# Patient Record
Sex: Male | Born: 1980 | Race: Black or African American | Hispanic: No | Marital: Single | Smoking: Current every day smoker
Health system: Southern US, Community
[De-identification: ages and names within clinical notes are randomized; demographics above are authoritative.]

## PROBLEM LIST (undated history)

## (undated) DIAGNOSIS — N200 Calculus of kidney: Secondary | ICD-10-CM

## (undated) DIAGNOSIS — R001 Bradycardia, unspecified: Secondary | ICD-10-CM

## (undated) HISTORY — DX: Bradycardia, unspecified: R00.1

## (undated) HISTORY — DX: Calculus of kidney: N20.0

---

## 2020-02-11 ENCOUNTER — Encounter: Payer: Self-pay | Admitting: Emergency Medicine

## 2020-02-11 ENCOUNTER — Emergency Department: Payer: Self-pay

## 2020-02-11 ENCOUNTER — Emergency Department
Admission: EM | Admit: 2020-02-11 | Discharge: 2020-02-11 | Disposition: A | Discharge status: Home / Self Care | Payer: Self-pay | Attending: Emergency Medicine | Admitting: Emergency Medicine

## 2020-02-11 ENCOUNTER — Other Ambulatory Visit: Payer: Self-pay

## 2020-02-11 DIAGNOSIS — S91114A Laceration without foreign body of right lesser toe(s) without damage to nail, initial encounter: Secondary | ICD-10-CM | POA: Insufficient documentation

## 2020-02-11 DIAGNOSIS — X58XXXA Exposure to other specified factors, initial encounter: Secondary | ICD-10-CM | POA: Insufficient documentation

## 2020-02-11 DIAGNOSIS — Z5321 Procedure and treatment not carried out due to patient leaving prior to being seen by health care provider: Secondary | ICD-10-CM | POA: Insufficient documentation

## 2020-02-11 NOTE — ED Triage Notes (Signed)
Pt in w/R 4th toe warmth, swelling. Noticed a cut to dorsal side of 4th toe on Friday, states he gave his dogs a bath that night, and swelling became worse. No fevers, does report clear and brown drainage. Has tried soaking in Epsom salt and A&D cream w/no relief. Able to bear weight on RLE

## 2020-04-27 ENCOUNTER — Encounter: Payer: Self-pay | Admitting: Emergency Medicine

## 2020-04-27 ENCOUNTER — Emergency Department
Admission: EM | Admit: 2020-04-27 | Discharge: 2020-04-27 | Disposition: A | Discharge status: Home / Self Care | Payer: Self-pay | Attending: Emergency Medicine | Admitting: Emergency Medicine

## 2020-04-27 ENCOUNTER — Emergency Department: Payer: Self-pay

## 2020-04-27 DIAGNOSIS — N200 Calculus of kidney: Secondary | ICD-10-CM

## 2020-04-27 DIAGNOSIS — F17210 Nicotine dependence, cigarettes, uncomplicated: Secondary | ICD-10-CM | POA: Insufficient documentation

## 2020-04-27 DIAGNOSIS — N132 Hydronephrosis with renal and ureteral calculous obstruction: Secondary | ICD-10-CM | POA: Insufficient documentation

## 2020-04-27 LAB — COMPREHENSIVE METABOLIC PANEL
ALT: 16 U/L (ref 0–44)
AST: 25 U/L (ref 15–41)
Albumin: 3.9 g/dL (ref 3.5–5.0)
Alkaline Phosphatase: 53 U/L (ref 38–126)
Anion gap: 9 (ref 5–15)
BUN: 13 mg/dL (ref 6–20)
CO2: 27 mmol/L (ref 22–32)
Calcium: 9.2 mg/dL (ref 8.9–10.3)
Chloride: 102 mmol/L (ref 98–111)
Creatinine, Ser: 1.11 mg/dL (ref 0.61–1.24)
GFR, Estimated: >60 mL/min (ref >60)
Glucose, Bld: 116 mg/dL — ABNORMAL HIGH (ref 70–99)
Potassium: 3.8 mmol/L (ref 3.5–5.1)
Sodium: 138 mmol/L (ref 135–145)
Total Bilirubin: 0.8 mg/dL (ref 0.3–1.2)
Total Protein: 7.1 g/dL (ref 6.5–8.1)

## 2020-04-27 LAB — URINALYSIS, COMPLETE (UACMP) WITH MICROSCOPIC
Bacteria, UA: NONE SEEN
Bilirubin Urine: NEGATIVE
Glucose, UA: NEGATIVE mg/dL
Hgb urine dipstick: NEGATIVE
Ketones, ur: 5 mg/dL — AB
Leukocytes,Ua: NEGATIVE
Nitrite: NEGATIVE
Protein, ur: NEGATIVE mg/dL
Specific Gravity, Urine: 1.027 (ref 1.005–1.030)
Squamous Epithelial / HPF: NONE SEEN (ref 0–5)
pH: 5 (ref 5.0–8.0)

## 2020-04-27 LAB — CBC
HCT: 42.8 % (ref 39.0–52.0)
Hemoglobin: 14.4 g/dL (ref 13.0–17.0)
MCH: 30.3 pg (ref 26.0–34.0)
MCHC: 33.6 g/dL (ref 30.0–36.0)
MCV: 90.1 fL (ref 80.0–100.0)
Platelets: 224 10*3/uL (ref 150–400)
RBC: 4.75 MIL/uL (ref 4.22–5.81)
RDW: 13 % (ref 11.5–15.5)
WBC: 6.3 10*3/uL (ref 4.0–10.5)
nRBC: 0 % (ref 0.0–0.2)

## 2020-04-27 LAB — LIPASE, BLOOD: Lipase: 38 U/L (ref 11–51)

## 2020-04-27 MED ORDER — ONDANSETRON 4 MG PO TBDP: 4.0000 mg | ORAL_TABLET | Freq: Four times a day (QID) | ORAL | 0 refills | Status: DC | PRN

## 2020-04-27 MED ORDER — MORPHINE SULFATE (PF) 4 MG/ML IV SOLN
4.0000 mg | Freq: Once | INTRAVENOUS | Status: AC
  Administered 2020-04-27: 4 mg via INTRAVENOUS
  Filled 2020-04-27: qty 1

## 2020-04-27 MED ORDER — IBUPROFEN 800 MG PO TABS: 800.0000 mg | ORAL_TABLET | Freq: Three times a day (TID) | ORAL | 0 refills | Status: DC | PRN

## 2020-04-27 MED ORDER — SODIUM CHLORIDE 0.9 % IV BOLUS (SEPSIS)
1000.0000 mL | Freq: Once | INTRAVENOUS | Status: AC
  Administered 2020-04-27: 1000 mL via INTRAVENOUS

## 2020-04-27 MED ORDER — ONDANSETRON HCL 4 MG/2ML IJ SOLN
4.0000 mg | Freq: Once | INTRAMUSCULAR | Status: AC
  Administered 2020-04-27: 4 mg via INTRAVENOUS
  Filled 2020-04-27: qty 2

## 2020-04-27 MED ORDER — TAMSULOSIN HCL 0.4 MG PO CAPS: 0.4000 mg | ORAL_CAPSULE | Freq: Every day | ORAL | 0 refills | Status: DC

## 2020-04-27 MED ORDER — OXYCODONE-ACETAMINOPHEN 7.5-325 MG PO TABS
2.0000 | ORAL_TABLET | Freq: Four times a day (QID) | ORAL | 0 refills | Status: AC | PRN
Start: 2020-04-27 — End: 2021-04-27

## 2020-04-27 MED ORDER — KETOROLAC TROMETHAMINE 30 MG/ML IJ SOLN
30.0000 mg | Freq: Once | INTRAMUSCULAR | Status: AC
  Administered 2020-04-27: 30 mg via INTRAVENOUS
  Filled 2020-04-27: qty 1

## 2020-04-27 NOTE — ED Provider Notes (Signed)
[facility] Emergency Department Provider Note  ____________________________________________   Event Date/Time   First MD Initiated Contact with Patient 04/27/20 [phone]     (approximate)  I have reviewed the triage vital signs and the nursing notes.   HISTORY  Chief Complaint Flank Pain    HPI Jake Lee is a 40 y.o. male with no significant past medical history who presented to the emergency department sudden onset right-sided flank pain that he describes as a pressure that radiates into the lower abdomen that started suddenly tonight while sleeping and woke him from sleep.  He denies nausea, vomiting, diarrhea, dysuria, hematuria, fever.  States he was sweating with the pain.  He has never had similar symptoms.  No previous abdominal surgery.  No history of kidney stones.        History reviewed. No pertinent past medical history.  There are no problems to display for this patient.   History reviewed. No pertinent surgical history.  Prior to Admission medications   Medication Sig Start Date End Date Taking? Authorizing Provider  ibuprofen (ADVIL) 800 MG tablet Take 1 tablet (800 mg total) by mouth every 8 (eight) hours as needed for mild pain. 04/27/20  Yes Ninoska Goswick N, DO  ondansetron (ZOFRAN ODT) 4 MG disintegrating tablet Take 1 tablet (4 mg total) by mouth every 6 (six) hours as needed for nausea or vomiting. 04/27/20  Yes Dwain Huhn, Layla Maw, DO  oxyCODONE-acetaminophen (PERCOCET) 7.5-325 MG tablet Take 2 tablets by mouth every 6 (six) hours as needed for severe pain. 04/27/20 04/27/21 Yes Latrel Szymczak, Layla Maw, DO  tamsulosin (FLOMAX) 0.4 MG CAPS capsule Take 1 capsule (0.4 mg total) by mouth daily. Take until stone passes. 04/27/20  Yes Akiera Allbaugh, Layla Maw, DO    Allergies Patient has no known allergies.  History reviewed. No pertinent family history.  Social History Social History   Tobacco Use  . Smoking status: Current Every Day Smoker    Packs/day:  0.50    Types: Cigarettes  . Smokeless tobacco: Never Used  Substance Use Topics  . Alcohol use: Not Currently  . Drug use: Not Currently    Review of Systems Constitutional: No fever. Eyes: No visual changes. ENT: No sore throat. Cardiovascular: Denies chest pain. Respiratory: Denies shortness of breath. Gastrointestinal: No nausea, vomiting, diarrhea. Genitourinary: Negative for dysuria. Musculoskeletal: Negative for back pain. Skin: Negative for rash. Neurological: Negative for focal weakness or numbness.  ____________________________________________   PHYSICAL EXAM:  VITAL SIGNS: ED Triage Vitals  Enc Vitals Group     BP 04/27/20 0554 (!) 149/96     Pulse Rate 04/27/20 0554 62     Resp 04/27/20 0554 20     Temp 04/27/20 0554 98.7 F (37.1 C)     Temp Source 04/27/20 0554 Oral     SpO2 04/27/20 0554 100 %     Weight 04/27/20 0552 165 lb (74.8 kg)     Height 04/27/20 0552 5\' 11"  (1.803 m)     Head Circumference --      Peak Flow --      Pain Score --      Pain Loc --      Pain Edu? --      Excl. in GC? --    CONSTITUTIONAL: Alert and oriented and responds appropriately to questions.  Appears uncomfortable. HEAD: Normocephalic EYES: Conjunctivae clear, pupils appear equal, EOM appear intact ENT: normal nose; moist mucous membranes NECK: Supple, normal ROM CARD: RRR; S1 and S2  appreciated; no murmurs, no clicks, no rubs, no gallops RESP: Normal chest excursion without splinting or tachypnea; breath sounds clear and equal bilaterally; no wheezes, no rhonchi, no rales, no hypoxia or respiratory distress, speaking full sentences ABD/GI: Normal bowel sounds; non-distended; soft, non-tender, no rebound, no guarding, no peritoneal signs, no hepatosplenomegaly, no tenderness at McBurney's point, negative Murphy sign BACK: The back appears normal, patient has right CVA tenderness, no midline spinal tenderness or step-off or deformity EXT: Normal ROM in all joints; no  deformity noted, no edema; no cyanosis SKIN: Normal color for age and race; warm; no rash on exposed skin NEURO: Moves all extremities equally PSYCH: The patient's mood and manner are appropriate.  ____________________________________________   LABS (all labs ordered are listed, but only abnormal results are displayed)  Labs Reviewed  COMPREHENSIVE METABOLIC PANEL - Abnormal; Notable for the following components:      Result Value   Glucose, Bld 116 (*)    All other components within normal limits  URINALYSIS, COMPLETE (UACMP) WITH MICROSCOPIC - Abnormal; Notable for the following components:   Color, Urine YELLOW (*)    APPearance CLEAR (*)    Ketones, ur 5 (*)    All other components within normal limits  LIPASE, BLOOD  CBC   ____________________________________________  EKG  None ____________________________________________  RADIOLOGY I, Dwan Fennel, personally viewed and evaluated these images (plain radiographs) as part of my medical decision making, as well as reviewing the written report by the radiologist.  ED MD interpretation: Right-sided kidney stone  Official radiology report(s): CT Renal Stone Study  Result Date: 04/27/2020 CLINICAL DATA:  New onset right flank pain.  Kidney stone suspected. EXAM: CT ABDOMEN AND PELVIS WITHOUT CONTRAST TECHNIQUE: Multidetector CT imaging of the abdomen and pelvis was performed following the standard protocol without IV contrast. COMPARISON:  None. FINDINGS: Lower chest: The lung bases are clear without focal nodule, mass, or airspace disease. Heart size is normal. No significant pleural or pericardial effusion is present. Hepatobiliary: No focal liver abnormality is seen. No gallstones, gallbladder wall thickening, or biliary dilatation. Pancreas: Unremarkable. No pancreatic ductal dilatation or surrounding inflammatory changes. Spleen: Normal in size without focal abnormality. Adrenals/Urinary Tract: The adrenal glands are normal  bilaterally. A punctate nonobstructing stone is present at the lower pole of the right kidney. A distal right ureteral stone at the UPJ measures less than 2 mm. Mild right-sided hydronephrosis is present. Left renal collecting system and ureter are within normal limits. The urinary bladder is mostly collapsed. Stomach/Bowel: The stomach and duodenum are within normal limits. Small bowel is unremarkable. There is some stranding throughout the mesentery. Terminal ileum is within normal limits. Appendix is visualized and normal. The ascending and transverse colon are within normal limits. Descending and sigmoid colon are normal. Vascular/Lymphatic: No significant vascular findings are present. No enlarged abdominal or pelvic lymph nodes. Reproductive: Prostate is unremarkable. Other: Small amount of free fluid is noted dependently within the anatomic pelvis. No free air is present. No significant ventral hernia is present. Musculoskeletal: Vertebral body heights and alignment are normal. No focal lytic or blastic lesions are present. Bony pelvis is within normal limits. Hips are located and normal bilaterally. IMPRESSION: 1. Obstructing distal right ureteral stone at the UPJ measuring less than 2 mm. 2. Mild right-sided hydronephrosis. 3. Additional punctate nonobstructing stone at the lower pole of the right kidney. 4. Small amount of free fluid dependently within the anatomic pelvis is likely reactive. Electronically Signed   By: Marin Roberts  M.D.   On: 04/27/2020 07:02    ____________________________________________   PROCEDURES  Procedure(s) performed (including Critical Care):  None  ____________________________________________   INITIAL IMPRESSION / ASSESSMENT AND PLAN / ED COURSE  As part of my medical decision making, I reviewed the following data within the electronic MEDICAL RECORD NUMBER History obtained from family, Nursing notes reviewed and incorporated, Labs reviewed, CT imaging  reviewed, Notes from prior ED visits and [place] Controlled Substance Database         Patient here with sudden onset right flank pain.  Differential includes kidney stone, pyelonephritis, appendicitis, cholecystitis, pancreatitis.  Will obtain labs, urine.  Will give IV fluids, pain and nausea medicine.  Will obtain CT of the abdomen pelvis.    7:27 AM  Pt reports feeling better.  Labs unremarkable.  Normal LFTs, renal function, lipase.  No leukocytosis.  Urine does not appear infected.  No blood.  CT scan shows an obstructing distal right ureteral stone at the UPJ less than 2 mm with mild right hydronephrosis.  Appendix appears normal.  Discussed findings with patient and mother at bedside.  Will provide urology outpatient follow-up.  Discussed supportive care instructions and return precautions.  Will discharge with pain and nausea medicine as well as Flomax.  They verbalized understanding.  At this time, I do not feel there is any life-threatening condition present. I have reviewed, interpreted and discussed all results (EKG, imaging, lab, urine as appropriate) and exam findings with patient/family. I have reviewed nursing notes and appropriate previous records.  I feel the patient is safe to be discharged home without further emergent workup and can continue workup as an outpatient as needed. Discussed usual and customary return precautions. Patient/family verbalize understanding and are comfortable with this plan.  Outpatient follow-up has been provided as needed. All questions have been answered.   ____________________________________________   FINAL CLINICAL IMPRESSION(S) / ED DIAGNOSES  Final diagnoses:  Right kidney stone     ED Discharge Orders         Ordered    oxyCODONE-acetaminophen (PERCOCET) 7.5-325 MG tablet  Every 6 hours PRN        04/27/20 0730    ibuprofen (ADVIL) 800 MG tablet  Every 8 hours PRN        04/27/20 0730    tamsulosin (FLOMAX) 0.4 MG CAPS capsule  Daily         04/27/20 0730    ondansetron (ZOFRAN ODT) 4 MG disintegrating tablet  Every 6 hours PRN        04/27/20 0730          *Please note:  Jake Lee was evaluated in Emergency Department on 04/27/2020 for the symptoms described in the history of present illness. He was evaluated in the context of the global COVID-19 pandemic, which necessitated consideration that the patient might be at risk for infection with the SARS-CoV-2 virus that causes COVID-19. Institutional protocols and algorithms that pertain to the evaluation of patients at risk for COVID-19 are in a state of rapid change based on information released by regulatory bodies including the CDC and federal and state organizations. These policies and algorithms were followed during the patient's care in the ED.  Some ED evaluations and interventions may be delayed as a result of limited staffing during and the pandemic.*   Note:  This document was prepared using Dragon voice recognition software and may include unintentional dictation errors.   Tyquasia Pant, Layla Maw, DO 04/27/20 0730

## 2020-04-27 NOTE — ED Triage Notes (Signed)
Pt c/o sudden onset of right flank pain this AM. Pt denies N/V/D as well as urinary symptoms.

## 2020-05-19 ENCOUNTER — Encounter: Payer: Self-pay | Admitting: Urology

## 2020-05-19 ENCOUNTER — Ambulatory Visit (INDEPENDENT_AMBULATORY_CARE_PROVIDER_SITE_OTHER): Payer: Self-pay | Admitting: Urology

## 2020-05-19 ENCOUNTER — Other Ambulatory Visit: Payer: Self-pay

## 2020-05-19 VITALS — BP 114/78 | HR 59 | Ht 71.0 in | Wt 164.9 lb

## 2020-05-19 DIAGNOSIS — N200 Calculus of kidney: Secondary | ICD-10-CM

## 2020-05-19 NOTE — Progress Notes (Signed)
   05/19/20 11:59 AM   Jake Lee Apr 15, 1980 [id number]  CC: Kidney stones  HPI: I saw Jake Lee in urology clinic today for follow-up of a right ureteral stone.  He was seen in the ER on 04/27/2020 with severe right-sided flank pain, and CT showed a 2 mm right distal ureteral stone with mild hydronephrosis.  There is also a possible punctate 1 mm right lower pole stone.  He denies any prior history of kidney stones.  He is not had any more severe attacks of flank pain or urinary symptoms and is doing well.  Urinalysis today is completely benign.  Social History:  reports that he has been smoking cigarettes. He has been smoking about 0.50 packs per day. He has never used smokeless tobacco. He reports previous alcohol use. He reports previous drug use.  Physical Exam: BP 114/78 (BP Location: Left Arm, Patient Position: Sitting, Cuff Size: Normal)   Pulse (!) 59   Ht 5\' 11"  (1.803 m)   Wt 164 lb 14.4 oz (74.8 kg)   BMI 23.00 kg/m    Constitutional:  Alert and oriented, No acute distress. Cardiovascular: No clubbing, cyanosis, or edema. Respiratory: Normal respiratory effort, no increased work of breathing. GI: Abdomen is soft, nontender, nondistended, no abdominal masses GU: nom CVA tenderness  Laboratory Data: Reviewed, see HPI  Pertinent Imaging: I have personally viewed and interpreted the CT dated 04/27/2020 showing a 2 mm right distal ureteral stone and possible punctate right lower pole stone  Assessment & Plan:   40 year old male with history of a small right distal ureteral stone.  Based on resolution of symptoms and benign urinalysis today, suspect this passed spontaneously.  We discussed general stone prevention strategies including adequate hydration with goal of producing 2.5 L of urine daily, increasing citric acid intake, increasing calcium intake during high oxalate meals, minimizing animal protein, and decreasing salt intake. Information about dietary recommendations  given today.   Follow-up as needed   45, MD 05/19/2020  [facility] [address] [place], [place] [phone]

## 2020-05-19 NOTE — Patient Instructions (Signed)
Dietary Guidelines to Help Prevent Kidney Stones Kidney stones are deposits of minerals and salts that form inside your kidneys. Your risk of developing kidney stones may be greater depending on your diet, your lifestyle, the medicines you take, and whether you have certain medical conditions. Most people can lower their chances of developing kidney stones by following the instructions below. Your dietitian may give you more specific instructions depending on your overall health and the type of kidney stones you tend to develop. What are tips for following this plan? Reading food labels  Choose foods with "no salt added" or "low-salt" labels. Limit your salt (sodium) intake to less than 1,500 mg a day.  Choose foods with calcium for each meal and snack. Try to eat about 300 mg of calcium at each meal. Foods that contain 200-500 mg of calcium a serving include: ? 8 oz (237 mL) of milk, calcium-fortifiednon-dairy milk, and calcium-fortifiedfruit juice. Calcium-fortified means that calcium has been added to these drinks. ? 8 oz (237 mL) of kefir, yogurt, and soy yogurt. ? 4 oz (114 g) of tofu. ? 1 oz (28 g) of cheese. ? 1 cup (150 g) of dried figs. ? 1 cup (91 g) of cooked broccoli. ? One 3 oz (85 g) can of sardines or mackerel. Most people need 1,000-1,500 mg of calcium a day. Talk to your dietitian about how much calcium is recommended for you.   Shopping  Buy plenty of fresh fruits and vegetables. Most people do not need to avoid fruits and vegetables, even if these foods contain nutrients that may contribute to kidney stones.  When shopping for convenience foods, choose: ? Whole pieces of fruit. ? Pre-made salads with dressing on the side. ? Low-fat fruit and yogurt smoothies.  Avoid buying frozen meals or prepared deli foods. These can be high in sodium.  Look for foods with live cultures, such as yogurt and kefir.  Choose high-fiber grains, such as whole-wheat breads, oat bran, and  wheat cereals. Cooking  Do not add salt to food when cooking. Place a salt shaker on the table and allow each person to add his or her own salt to taste.  Use vegetable protein, such as beans, textured vegetable protein (TVP), or tofu, instead of meat in pasta, casseroles, and soups. Meal planning  Eat less salt, if told by your dietitian. To do this: ? Avoid eating processed or pre-made food. ? Avoid eating fast food.  Eat less animal protein, including cheese, meat, poultry, or fish, if told by your dietitian. To do this: ? Limit the number of times you have meat, poultry, fish, or cheese each week. Eat a diet free of meat at least 2 days a week. ? Eat only one serving each day of meat, poultry, fish, or seafood. ? When you prepare animal protein, cut pieces into small portion sizes. For most meat and fish, one serving is about the size of the palm of your hand.  Eat at least five servings of fresh fruits and vegetables each day. To do this: ? Keep fruits and vegetables on hand for snacks. ? Eat one piece of fruit or a handful of berries with breakfast. ? Have a salad and fruit at lunch. ? Have two kinds of vegetables at dinner.  Limit foods that are high in a substance called oxalate. These include: ? Spinach (cooked), rhubarb, beets, sweet potatoes, and Swiss chard. ? Peanuts. ? Potato chips, french fries, and baked potatoes with skin on. ? Nuts and   nut products. ? Chocolate.  If you regularly take a diuretic medicine, make sure to eat at least 1 or 2 servings of fruits or vegetables that are high in potassium each day. These include: ? Avocado. ? Banana. ? Orange, prune, carrot, or tomato juice. ? Baked potato. ? Cabbage. ? Beans and split peas. Lifestyle  Drink enough fluid to keep your urine pale yellow. This is the most important thing you can do. Spread your fluid intake throughout the day.  If you drink alcohol: ? Limit how much you use to:  0-1 drink a day for  women who are not pregnant.  0-2 drinks a day for men. ? Be aware of how much alcohol is in your drink. In the [place] one drink equals one 12 oz bottle of beer (355 mL), one 5 oz glass of wine (148 mL), or one 1 oz glass of hard liquor (44 mL).  Lose weight if told by your health care provider. Work with your dietitian to find an eating plan and weight loss strategies that work best for you.   General information  Talk to your health care provider and dietitian about taking daily supplements. You may be told the following depending on your health and the cause of your kidney stones: ? Not to take supplements with vitamin C. ? To take a calcium supplement. ? To take a daily probiotic supplement. ? To take other supplements such as magnesium, fish oil, or vitamin B6.  Take over-the-counter and prescription medicines only as told by your health care provider. These include supplements. What foods should I limit? Limit your intake of the following foods, or eat them as told by your dietitian. Vegetables Spinach. Rhubarb. Beets. Canned vegetables. Pickles. Olives. Baked potatoes with skin. Grains Wheat bran. Baked goods. Salted crackers. Cereals high in sugar. Meats and other proteins Nuts. Nut butters. Large portions of meat, poultry, or fish. Salted, precooked, or cured meats, such as sausages, meat loaves, and hot dogs. Dairy Cheese. Beverages Regular soft drinks. Regular vegetable juice. Seasonings and condiments Seasoning blends with salt. Salad dressings. Soy sauce. Ketchup. Barbecue sauce. Other foods Canned soups. Canned pasta sauce. Casseroles. Pizza. Lasagna. Frozen meals. Potato chips. French fries. The items listed above may not be a complete list of foods and beverages you should limit. Contact a dietitian for more information. What foods should I avoid? Talk to your dietitian about specific foods you should avoid based on the type of kidney stones you have and your overall  health. Fruits Grapefruit. The item listed above may not be a complete list of foods and beverages you should avoid. Contact a dietitian for more information. Summary  Kidney stones are deposits of minerals and salts that form inside your kidneys.  You can lower your risk of kidney stones by making changes to your diet.  The most important thing you can do is drink enough fluid. Drink enough fluid to keep your urine pale yellow.  Talk to your dietitian about how much calcium you should have each day, and eat less salt and animal protein as told by your dietitian. This information is not intended to replace advice given to you by your health care provider. Make sure you discuss any questions you have with your health care provider. Document Revised: 03/01/2019 Document Reviewed: 03/01/2019 Elsevier Patient Education  2021 [employer]  

## 2020-05-20 LAB — URINALYSIS, COMPLETE
Bilirubin, UA: NEGATIVE
Glucose, UA: NEGATIVE
Ketones, UA: NEGATIVE
Leukocytes,UA: NEGATIVE
Nitrite, UA: NEGATIVE
Protein,UA: NEGATIVE
RBC, UA: NEGATIVE
Specific Gravity, UA: 1.025 (ref 1.005–1.030)
Urobilinogen, Ur: 0.2 mg/dL (ref 0.2–1.0)
pH, UA: 6.5 (ref 5.0–7.5)

## 2020-05-20 LAB — MICROSCOPIC EXAMINATION
Bacteria, UA: NONE SEEN
Epithelial Cells (non renal): NONE SEEN /hpf (ref 0–10)

## 2021-04-29 ENCOUNTER — Other Ambulatory Visit: Payer: Self-pay

## 2021-04-29 ENCOUNTER — Emergency Department: Payer: Self-pay

## 2021-04-29 ENCOUNTER — Emergency Department
Admission: EM | Admit: 2021-04-29 | Discharge: 2021-04-29 | Disposition: A | Discharge status: Home / Self Care | Payer: Self-pay | Attending: Student in an Organized Health Care Education/Training Program | Admitting: Student in an Organized Health Care Education/Training Program

## 2021-04-29 ENCOUNTER — Encounter: Payer: Self-pay | Admitting: Emergency Medicine

## 2021-04-29 DIAGNOSIS — S0990XA Unspecified injury of head, initial encounter: Secondary | ICD-10-CM | POA: Insufficient documentation

## 2021-04-29 DIAGNOSIS — Z23 Encounter for immunization: Secondary | ICD-10-CM | POA: Insufficient documentation

## 2021-04-29 DIAGNOSIS — R55 Syncope and collapse: Secondary | ICD-10-CM | POA: Insufficient documentation

## 2021-04-29 DIAGNOSIS — S01111A Laceration without foreign body of right eyelid and periocular area, initial encounter: Secondary | ICD-10-CM | POA: Insufficient documentation

## 2021-04-29 DIAGNOSIS — S01112A Laceration without foreign body of left eyelid and periocular area, initial encounter: Secondary | ICD-10-CM

## 2021-04-29 DIAGNOSIS — W01198A Fall on same level from slipping, tripping and stumbling with subsequent striking against other object, initial encounter: Secondary | ICD-10-CM | POA: Insufficient documentation

## 2021-04-29 LAB — COMPREHENSIVE METABOLIC PANEL
ALT: 14 U/L (ref 0–44)
AST: 19 U/L (ref 15–41)
Albumin: 4.1 g/dL (ref 3.5–5.0)
Alkaline Phosphatase: 54 U/L (ref 38–126)
Anion gap: 4 — ABNORMAL LOW (ref 5–15)
BUN: 13 mg/dL (ref 6–20)
CO2: 29 mmol/L (ref 22–32)
Calcium: 9.1 mg/dL (ref 8.9–10.3)
Chloride: 102 mmol/L (ref 98–111)
Creatinine, Ser: 0.83 mg/dL (ref 0.61–1.24)
GFR, Estimated: >60 mL/min (ref >60)
Glucose, Bld: 89 mg/dL (ref 70–99)
Potassium: 3.8 mmol/L (ref 3.5–5.1)
Sodium: 135 mmol/L (ref 135–145)
Total Bilirubin: 1.2 mg/dL (ref 0.3–1.2)
Total Protein: 7.9 g/dL (ref 6.5–8.1)

## 2021-04-29 LAB — CBC
HCT: 44.9 % (ref 39.0–52.0)
Hemoglobin: 15 g/dL (ref 13.0–17.0)
MCH: 30.7 pg (ref 26.0–34.0)
MCHC: 33.4 g/dL (ref 30.0–36.0)
MCV: 91.8 fL (ref 80.0–100.0)
Platelets: 190 10*3/uL (ref 150–400)
RBC: 4.89 MIL/uL (ref 4.22–5.81)
RDW: 13.2 % (ref 11.5–15.5)
WBC: 5.4 10*3/uL (ref 4.0–10.5)
nRBC: 0 % (ref 0.0–0.2)

## 2021-04-29 LAB — TROPONIN I (HIGH SENSITIVITY)
Troponin I (High Sensitivity): 4 ng/L (ref <18)
Troponin I (High Sensitivity): 3 ng/L (ref <18)

## 2021-04-29 MED ORDER — LIDOCAINE HCL (PF) 1 % IJ SOLN
5.0000 mL | Freq: Once | INTRAMUSCULAR | Status: AC
  Administered 2021-04-29: 5 mL via INTRADERMAL
  Filled 2021-04-29: qty 5

## 2021-04-29 MED ORDER — LIDOCAINE-EPINEPHRINE-TETRACAINE (LET) TOPICAL GEL
3.0000 mL | Freq: Once | TOPICAL | Status: AC
  Administered 2021-04-29: 3 mL via TOPICAL
  Filled 2021-04-29: qty 3

## 2021-04-29 MED ORDER — TETANUS-DIPHTH-ACELL PERTUSSIS 5-2.5-18.5 LF-MCG/0.5 IM SUSY
0.5000 mL | PREFILLED_SYRINGE | Freq: Once | INTRAMUSCULAR | Status: AC
  Administered 2021-04-29: 0.5 mL via INTRAMUSCULAR
  Filled 2021-04-29: qty 0.5

## 2021-04-29 NOTE — ED Triage Notes (Signed)
First Nurse Note:  C?O feeling dizzy this morning.  States walked outside to get some fresh air and passed out, hitting head/face on railing.  Small lacerations seen to bridge of nose and left eyebrow/ forehead. Bleeding controlled.  Patient is AAOx3.  Skin warm and dry . Ambulates with easy and steady gait.  NAD

## 2021-04-29 NOTE — ED Provider Notes (Signed)
[facility] Provider Note    Event Date/Time   First MD Initiated Contact with Patient 04/29/21 [phone]     (approximate)   History   Loss of Consciousness   HPI  Jake Lee is a 41 y.o. male previously healthy young male who presents to the ER for evaluation of near fainting spell that occurred this morning.  States he was feeling lightheaded and got up this morning went outside to get some fresh air.  States when he got outside felt faint and fell to the ground striking his forehead on the rail.  Does have some forehead pain.  Denies any other injury.  Denies any chest pain or shortness of breath.     Physical Exam   Triage Vital Signs: ED Triage Vitals  Enc Vitals Group     BP 04/29/21 0751 117/82     Pulse Rate 04/29/21 0751 (!) 48     Resp 04/29/21 0751 18     Temp 04/29/21 0751 (!) 97.4 F (36.3 C)     Temp Source 04/29/21 0751 Oral     SpO2 04/29/21 0751 96 %     Weight 04/29/21 0744 164 lb 14.5 oz (74.8 kg)     Height 04/29/21 0744 5\' 11"  (1.803 m)     Head Circumference --      Peak Flow --      Pain Score 04/29/21 0744 6     Pain Loc --      Pain Edu? --      Excl. in GC? --     Most recent vital signs: Vitals:   04/29/21 0857 04/29/21 1015  BP: 120/80 121/74  Pulse: (!) 55 (!) 59  Resp: 17 17  Temp: 98.5 F (36.9 C) 98.5 F (36.9 C)  SpO2: 96% 98%     Constitutional: Alert  Eyes: Conjunctivae are normal.  Head: 2 cm laceration in the medial left eyebrow.  No raccoon eyes no battle sign, Nose: No congestion/rhinnorhea. Mouth/Throat: Mucous membranes are moist.   Neck: Painless ROM. No step off or deformity Cardiovascular:   Good peripheral circulation. Respiratory: Normal respiratory effort.  No retractions.  Gastrointestinal: Soft and nontender.  Musculoskeletal:  no deformity Neurologic:  MAE spontaneously. No gross focal neurologic deficits are appreciated.  Skin:  Skin is warm, dry and intact. No rash  noted. Psychiatric: Mood and affect are normal. Speech and behavior are normal.    ED Results / Procedures / Treatments   Labs (all labs ordered are listed, but only abnormal results are displayed) Labs Reviewed  COMPREHENSIVE METABOLIC PANEL - Abnormal; Notable for the following components:      Result Value   Anion gap 4 (*)    All other components within normal limits  CBC  TROPONIN I (HIGH SENSITIVITY)  TROPONIN I (HIGH SENSITIVITY)     EKG  ED ECG REPORT I, 06/27/21, the attending physician, personally viewed and interpreted this ECG.   Date: 04/29/2021  EKG Time: 7:52  Rate: 48  Rhythm: sinus bradycardia  Axis: normal  Intervals:normal intervals  ST&T Change: no stemi, no depression    RADIOLOGY Please see ED Course for my review and interpretation.  I personally reviewed all radiographic images ordered to evaluate for the above acute complaints and reviewed radiology reports and findings.  These findings were personally discussed with the patient.  Please see medical record for radiology report.    PROCEDURES:  Critical Care performed: No  ..Laceration Repair  Date/Time:  04/29/2021 10:26 AM Performed by: Willy Eddy, MD Authorized by: Willy Eddy, MD   Consent:    Consent obtained:  Verbal   Consent given by:  Patient   Risks discussed:  Infection, pain, retained foreign body, poor cosmetic result and poor wound healing Anesthesia:    Anesthesia method:  Local infiltration   Local anesthetic:  Lidocaine 1% w/o epi Laceration details:    Location:  Face   Face location:  L eyebrow   Length (cm):  2   Depth (mm):  2 Exploration:    Hemostasis achieved with:  Direct pressure   Contaminated: no   Treatment:    Area cleansed with:  Saline and povidone-iodine   Amount of cleaning:  Extensive   Irrigation solution:  Sterile saline   Visualized foreign bodies/material removed: no   Skin repair:    Repair method:  Sutures    Suture size:  6-0   Suture technique:  Simple interrupted   Number of sutures:  3 Approximation:    Approximation:  Close Repair type:    Repair type:  Simple Post-procedure details:    Dressing:  Sterile dressing   Procedure completion:  Tolerated well, no immediate complications   MEDICATIONS ORDERED IN ED: Medications  lidocaine-EPINEPHrine-tetracaine (LET) topical gel (3 mLs Topical Given 04/29/21 0822)  Tdap (BOOSTRIX) injection 0.5 mL (0.5 mLs Intramuscular Given 04/29/21 0822)  lidocaine (PF) (XYLOCAINE) 1 % injection 5 mL (5 mLs Intradermal Given by Other 04/29/21 0856)     IMPRESSION / MDM / ASSESSMENT AND PLAN / ED COURSE  I reviewed the triage vital signs and the nursing notes.                              Differential diagnosis includes, but is not limited to, dehydration, orthostasis, bradycardia, acs, electrolyte abn, sdh, iph   Patient presenting with symptoms as described above.  Setting his airway with reassuring neuro exam.  Lacerations to be repaired.  Will update tetanus.  Blood will be sent for by differential.  The patient will be placed on continuous pulse oximetry and telemetry for monitoring.  Laboratory evaluation will be sent to evaluate for the above complaints.      Clinical Course as of 04/29/21 1158  Wed Apr 29, 2021  5726 Chest x-ray per my review does not show any evidence of pneumothorax. [PR]  0901 CT imaging by my review does not show any evidence of fracture or subdural hematoma. [PR]  1045 Patient feeling improved.  Consider observation in the hospital given patient's bradycardia but is otherwise young healthy appearing as suspect this is normal bradycardia for him.  Was able to ambulate up to the bathroom.  Denies any chest pain or pressure.  Awaiting repeat troponin. [PR]  1155 Repeat troponin negative.  Patient remains well-appearing in no acute distress able to ambulate.  At this point I do believe he stable and appropriate for outpatient  follow-up. [PR]    Clinical Course User Index [PR] Willy Eddy, MD     FINAL CLINICAL IMPRESSION(S) / ED DIAGNOSES   Final diagnoses:  Syncope, unspecified syncope type  Eyebrow laceration, left, initial encounter  Injury of head, initial encounter     Rx / DC Orders   ED Discharge Orders     None        Note:  This document was prepared using Dragon voice recognition software and may include unintentional dictation errors.  Willy Eddy, MD 04/29/21 1158

## 2022-03-18 ENCOUNTER — Other Ambulatory Visit: Payer: Self-pay

## 2022-03-18 ENCOUNTER — Emergency Department
Admission: EM | Admit: 2022-03-18 | Discharge: 2022-03-18 | Disposition: A | Discharge status: Home / Self Care | Payer: Self-pay | Attending: Emergency Medicine | Admitting: Emergency Medicine

## 2022-03-18 ENCOUNTER — Encounter: Payer: Self-pay | Admitting: Emergency Medicine

## 2022-03-18 DIAGNOSIS — M5441 Lumbago with sciatica, right side: Secondary | ICD-10-CM | POA: Insufficient documentation

## 2022-03-18 LAB — URINALYSIS, ROUTINE W REFLEX MICROSCOPIC
Bilirubin Urine: NEGATIVE
Glucose, UA: NEGATIVE mg/dL
Hgb urine dipstick: NEGATIVE
Ketones, ur: NEGATIVE mg/dL
Leukocytes,Ua: NEGATIVE
Nitrite: NEGATIVE
Protein, ur: NEGATIVE mg/dL
Specific Gravity, Urine: 1.025 (ref 1.005–1.030)
pH: 5 (ref 5.0–8.0)

## 2022-03-18 MED ORDER — KETOROLAC TROMETHAMINE 60 MG/2ML IM SOLN
30.0000 mg | Freq: Once | INTRAMUSCULAR | Status: AC
  Administered 2022-03-18: 30 mg via INTRAMUSCULAR
  Filled 2022-03-18: qty 2

## 2022-03-18 MED ORDER — CYCLOBENZAPRINE HCL 5 MG PO TABS: ORAL_TABLET | ORAL | 0 refills | Status: DC

## 2022-03-18 MED ORDER — TETANUS-DIPHTH-ACELL PERTUSSIS 5-2.5-18.5 LF-MCG/0.5 IM SUSY: 0.5000 mL | PREFILLED_SYRINGE | Freq: Once | INTRAMUSCULAR | Status: DC

## 2022-03-18 MED ORDER — HYDROCODONE-ACETAMINOPHEN 5-325 MG PO TABS: 1.0000 | ORAL_TABLET | Freq: Four times a day (QID) | ORAL | 0 refills | Status: DC | PRN

## 2022-03-18 MED ORDER — IBUPROFEN 800 MG PO TABS: 800.0000 mg | ORAL_TABLET | Freq: Three times a day (TID) | ORAL | 0 refills | Status: DC | PRN

## 2022-03-18 MED ORDER — METHYLPREDNISOLONE 4 MG PO TBPK: ORAL_TABLET | ORAL | 0 refills | Status: DC

## 2022-03-18 NOTE — ED Provider Notes (Signed)
[facility] Provider Note    Event Date/Time   First MD Initiated Contact with Patient 03/18/22 [phone]     (approximate)   History   Back Pain   HPI  Jake Lee is a 41 y.o. male  who presents to the ED from home with a chief complaint of nontraumatic right lower back pain radiating into his buttock and leg. Patient recently drove from [place] and reports right lower back spasms exacerbated by movement. Denies trauma/fall/injury. Denies hematuria, chest pain, sob, calf swelling/tenderness. Denies extremity weakness/numbness or tingling. Denies bowel or bladder incontinence.      Past Medical History   Past Medical History:  Diagnosis Date   Kidney stone      Active Problem List  There are no problems to display for this patient.    Past Surgical History  History reviewed. No pertinent surgical history.   Home Medications   Prior to Admission medications   Medication Sig Start Date End Date Taking? Authorizing Provider  cyclobenzaprine (FLEXERIL) 5 MG tablet 1 tablet every 8 hours as he did for muscle spasms 03/18/22  Yes Irean Hong, MD  HYDROcodone-acetaminophen (NORCO) 5-325 MG tablet Take 1 tablet by mouth every 6 (six) hours as needed for moderate pain. 03/18/22  Yes Irean Hong, MD  ibuprofen (ADVIL) 800 MG tablet Take 1 tablet (800 mg total) by mouth every 8 (eight) hours as needed for moderate pain. 03/18/22  Yes Irean Hong, MD  methylPREDNISolone (MEDROL DOSEPAK) 4 MG TBPK tablet Take as directed 03/18/22  Yes Irean Hong, MD  ondansetron (ZOFRAN ODT) 4 MG disintegrating tablet Take 1 tablet (4 mg total) by mouth every 6 (six) hours as needed for nausea or vomiting. Patient not taking: Reported on 04/29/2021 04/27/20   Ward, Layla Maw, DO  tamsulosin (FLOMAX) 0.4 MG CAPS capsule Take 1 capsule (0.4 mg total) by mouth daily. Take until stone passes. Patient not taking: Reported on 04/29/2021 04/27/20   Ward, Layla Maw, DO     Allergies   Patient has no known allergies.   Family History  History reviewed. No pertinent family history.   Physical Exam  Triage Vital Signs: ED Triage Vitals  Enc Vitals Group     BP 03/18/22 0134 134/87     Pulse Rate 03/18/22 0134 68     Resp 03/18/22 0134 16     Temp 03/18/22 0134 98.1 F (36.7 C)     Temp Source 03/18/22 0134 Oral     SpO2 03/18/22 0134 95 %     Weight 03/18/22 0136 175 lb (79.4 kg)     Height 03/18/22 0136 5\' 11"  (1.803 m)     Head Circumference --      Peak Flow --      Pain Score 03/18/22 0136 5     Pain Loc --      Pain Edu? --      Excl. in GC? --     Updated Vital Signs: BP 134/87 (BP Location: Right Arm)   Pulse 68   Temp 98.1 F (36.7 C) (Oral)   Resp 16   Ht 5\' 11"  (1.803 m)   Wt 79.4 kg   SpO2 95%   BMI 24.41 kg/m    General: Awake, no distress.  CV:  Good peripheral perfusion.  Resp:  Normal effort.  Abd:  No distention.  Other:  No spinal ttp. Right paraspinal lumbar muscle spasms. - SLR. 5/5 motor strength and sensation BLE.  2+ femoral and distal pulses.   ED Results / Procedures / Treatments  Labs (all labs ordered are listed, but only abnormal results are displayed) Labs Reviewed  URINALYSIS, ROUTINE W REFLEX MICROSCOPIC - Abnormal; Notable for the following components:      Result Value   Color, Urine YELLOW (*)    APPearance HAZY (*)    All other components within normal limits     EKG     RADIOLOGY    Official radiology report(s): No results found.   PROCEDURES:  Critical Care performed: No  Procedures   MEDICATIONS ORDERED IN ED: Medications  ketorolac (TORADOL) injection 30 mg (has no administration in time range)     IMPRESSION / MDM / ASSESSMENT AND PLAN / ED COURSE  I reviewed the triage vital signs and the nursing notes.                             41 year old male presenting with right lower back pain. UA negative. Will treat symptoms of sciatica with Medrol dose pack, Motrin, Norco,  Flexeril as needed. Patient will follow up with orthopedics as needed. Strict return precautions given. Patient verbalizes understanding and agrees with plan of care.  Patient's presentation is most consistent with acute, uncomplicated illness.   FINAL CLINICAL IMPRESSION(S) / ED DIAGNOSES   Final diagnoses:  Acute right-sided low back pain with right-sided sciatica     Rx / DC Orders   ED Discharge Orders          Ordered    cyclobenzaprine (FLEXERIL) 5 MG tablet        03/18/22 0501    ibuprofen (ADVIL) 800 MG tablet  Every 8 hours PRN        03/18/22 0501    HYDROcodone-acetaminophen (NORCO) 5-325 MG tablet  Every 6 hours PRN        03/18/22 0501    methylPREDNISolone (MEDROL DOSEPAK) 4 MG TBPK tablet        03/18/22 0501             Note:  This document was prepared using Dragon voice recognition software and may include unintentional dictation errors.   Irean Hong, MD 03/18/22 [phone]

## 2022-03-18 NOTE — ED Triage Notes (Signed)
Patient ambulatory to triage with steady gait, without difficulty or distress noted; pt reports left lower back pain, nonradiating with no accomp symptoms

## 2022-03-18 NOTE — Discharge Instructions (Signed)
You may take medicines as needed for pain & muscle spasms (Motrin/Norco/Flexeril #15). Take steroid taper as prescribed. Return to the ER for worsening symptoms, persistent vomiting, extremity weakness/numbness/tingling or other concerns.

## 2022-07-23 ENCOUNTER — Encounter: Payer: Self-pay | Admitting: Emergency Medicine

## 2022-07-23 ENCOUNTER — Emergency Department
Admission: EM | Admit: 2022-07-23 | Discharge: 2022-07-23 | Disposition: A | Discharge status: Home / Self Care | Payer: BC Managed Care – PPO | Attending: Emergency Medicine | Admitting: Emergency Medicine

## 2022-07-23 DIAGNOSIS — J069 Acute upper respiratory infection, unspecified: Secondary | ICD-10-CM | POA: Insufficient documentation

## 2022-07-23 DIAGNOSIS — Z20822 Contact with and (suspected) exposure to covid-19: Secondary | ICD-10-CM | POA: Diagnosis not present

## 2022-07-23 DIAGNOSIS — B9789 Other viral agents as the cause of diseases classified elsewhere: Secondary | ICD-10-CM | POA: Diagnosis not present

## 2022-07-23 DIAGNOSIS — L309 Dermatitis, unspecified: Secondary | ICD-10-CM | POA: Insufficient documentation

## 2022-07-23 DIAGNOSIS — R509 Fever, unspecified: Secondary | ICD-10-CM | POA: Diagnosis not present

## 2022-07-23 LAB — GROUP A STREP BY PCR: Group A Strep by PCR: NOT DETECTED

## 2022-07-23 LAB — SARS CORONAVIRUS 2 BY RT PCR: SARS Coronavirus 2 by RT PCR: NEGATIVE

## 2022-07-23 MED ORDER — CLOBETASOL PROPIONATE 0.05 % EX OINT: 1.0000 | TOPICAL_OINTMENT | Freq: Two times a day (BID) | CUTANEOUS | 0 refills | Status: DC

## 2022-07-23 NOTE — ED Triage Notes (Signed)
Pt presents ambulatory to triage via POV with complaints of sore throat and nasal congestion since Monday. Notes having intermittent fevers but never measured it, nor has he taken medication. Pt would also like to have his eczema evaluated as he is having a flare on the R side of his neck and on his R cheek. A&Ox4 at this time. Denies CP or SOB.

## 2022-07-23 NOTE — Discharge Instructions (Signed)
Try using over-the-counter medications such as cetirizine (Zyrtec), fluticasone nasal spray (Fluticasone), and ibuprofen and Tylenol.  Also try using a thin layer of the prescription clobetasol on your worst areas of eczema, but only use a minimal amount until it improves.  As we discussed, you should establish a primary care provider with whom you can follow-up in clinic.  A representative from the primary care clinics should contact you shortly to talk to you about scheduling a follow-up appointment.

## 2022-07-23 NOTE — ED Provider Notes (Signed)
[facility] Provider Note    Event Date/Time   First MD Initiated Contact with Patient 07/23/22 [phone]     (approximate)   History   Sore Throat and Rash   HPI Jake Lee is a 42 y.o. male who reports a history of eczema.  He states that for the last several days he has been gradually feeling worse and worse with subjective fevers, severe nasal congestion, and mild generalized headache.  No shortness of breath or cough.  He also said that his eczema is acting up and that the usual hydrocortisone cream or ointment that he uses does not seem to be helping.  It is itching a lot and bothering him, particularly on the right side of his face and neck.  He is not concerned about any insect bites or allergic contacts.  He has not had any nausea or vomiting or chest pain.  He does not have a primary care physician.     Physical Exam   Triage Vital Signs: ED Triage Vitals  Enc Vitals Group     BP 07/23/22 0542 (!) 139/97     Pulse Rate 07/23/22 0542 72     Resp 07/23/22 0542 18     Temp 07/23/22 0542 98.4 F (36.9 C)     Temp Source 07/23/22 0542 Oral     SpO2 07/23/22 0542 100 %     Weight 07/23/22 0544 78.5 kg (173 lb 1.7 oz)     Height 07/23/22 0544 1.803 m (5\' 11" )     Head Circumference --      Peak Flow --      Pain Score 07/23/22 0543 6     Pain Loc --      Pain Edu? --      Excl. in GC? --     Most recent vital signs: Vitals:   07/23/22 0542  BP: (!) 139/97  Pulse: 72  Resp: 18  Temp: 98.4 F (36.9 C)  SpO2: 100%    General: Awake, no distress.   HEENT: Obvious nasal congestion and erythematous nose.  Mildly erythematous posterior oropharynx, no exudate, no petechiae.  No other acute abnormalities. CV:  Good peripheral perfusion.  Resp:  Normal effort. Speaking easily and comfortably, no accessory muscle usage nor intercostal retractions.   Abd:  No distention.  Other:  Patient has eczematous rash most notable on the right side of his  neck and tracking up towards the right side of his jaw.  It is also present on intertriginous regions on his upper extremities.  Palms and soles are spared.  There is no evidence of infection including cellulitis nor impetigo.  Not consistent with insect bite such as scabies nor bedbugs.   ED Results / Procedures / Treatments   Labs (all labs ordered are listed, but only abnormal results are displayed) Labs Reviewed  GROUP A STREP BY PCR  SARS CORONAVIRUS 2 BY RT PCR     PROCEDURES:  Critical Care performed: No  Procedures    IMPRESSION / MDM / ASSESSMENT AND PLAN / ED COURSE  I reviewed the triage vital signs and the nursing notes.                              Differential diagnosis includes, but is not limited to, viral illness, eczema flare, bedbugs, scabies, cellulitis, medication or drug side effect.  Patient's presentation is most consistent with acute, uncomplicated illness.  Labs/studies ordered: Group A strep PCR, COVID-19 PCR  Interventions/Medications given:  Medications - No data to display  (Note:  hospital course my include additional interventions and/or labs/studies not listed above.)   Symptoms consistent with viral illness.  COVID-negative, group A strep negative.  No trouble speaking or swallowing, no difficulty breathing.  Patient in no distress but obvious is uncomfortable from his viral symptoms.  I prescribed clobetasol for his eczematous lesions but cautioned him multiple times to use it only sparingly.  I recommended outpatient medication such as cetirizine and fluticasone to help with the viral symptoms.  I provided PCP referral to help him get set up with outpatient follow-up.  He agrees with the plan.         FINAL CLINICAL IMPRESSION(S) / ED DIAGNOSES   Final diagnoses:  Viral URI  Eczema, unspecified type     Rx / DC Orders   ED Discharge Orders          Ordered    Ambulatory Referral to Primary Care (Establish Care)         07/23/22 0638    clobetasol ointment (TEMOVATE) 0.05 %  2 times daily        07/23/22 0645             Note:  This document was prepared using Dragon voice recognition software and may include unintentional dictation errors.   Loleta Rose, MD 07/23/22 [phone]

## 2022-07-24 DIAGNOSIS — H6122 Impacted cerumen, left ear: Secondary | ICD-10-CM | POA: Diagnosis not present

## 2022-07-24 DIAGNOSIS — J302 Other seasonal allergic rhinitis: Secondary | ICD-10-CM | POA: Diagnosis not present

## 2022-07-24 DIAGNOSIS — R0981 Nasal congestion: Secondary | ICD-10-CM | POA: Diagnosis not present

## 2022-07-24 DIAGNOSIS — J029 Acute pharyngitis, unspecified: Secondary | ICD-10-CM | POA: Diagnosis not present

## 2022-07-24 DIAGNOSIS — F17210 Nicotine dependence, cigarettes, uncomplicated: Secondary | ICD-10-CM | POA: Diagnosis not present

## 2022-09-07 NOTE — Progress Notes (Signed)
I,Vanessa  Vital,acting as a Neurosurgeon for [employer], DO.,have documented all relevant documentation on the behalf of [employer], DO,as directed by  [employer], DO while in the presence of Yoseline Andersson N Aries Townley, DO.   New patient visit   Patient: Jake Lee   DOB: 01-Aug-1980   42 y.o. Male  MRN: [id number] Visit Date: 09/08/2022  Today's healthcare provider: Sherlyn Hay, DO   Chief Complaint  Patient presents with   Establish Care   Subjective    Jake Lee is a 42 y.o. male who presents today as a new patient to establish care.  HPI  Patient would just like to establish care as advised. Also reports needing assistance find a dental office. OK with referral being placed  Patient is interested in attempting to quit smoking.  He states he has previously tried the patch and it made him nauseous.  Patient's partner does smoke inside the house.  He does not believe she would be willing to quit at this time.  Patient does have areas of eczema he would like to have addressed today; his mother also deals with eczema.  He tried her CeraVe cream and stated it was very helpful.  He does believe it was prescribed.  Patient denies any usual difficulty with allergy type symptoms, except last month when he went out fishing.    Past Medical History:  Diagnosis Date   Kidney stone    Sinus bradycardia    History reviewed. No pertinent surgical history. No family status information on file.   History reviewed. No pertinent family history. Social History   Socioeconomic History   Marital status: Single    Spouse name: Not on file   Number of children: Not on file   Years of education: Not on file   Highest education level: Not on file  Occupational History   Not on file  Tobacco Use   Smoking status: Every Day    Packs/day: .5    Types: Cigarettes   Smokeless tobacco: Never  Vaping Use   Vaping Use: Not on file  Substance and Sexual Activity   Alcohol use: Not  Currently   Drug use: Not Currently   Sexual activity: Yes    Birth control/protection: None  Other Topics Concern   Not on file  Social History Narrative   Not on file   Social Determinants of Health   Financial Resource Strain: Not on file  Food Insecurity: Not on file  Transportation Needs: Not on file  Physical Activity: Not on file  Stress: Not on file  Social Connections: Not on file   Outpatient Medications Prior to Visit  Medication Sig   [DISCONTINUED] clobetasol ointment (TEMOVATE) 0.05 % Apply 1 Application topically 2 (two) times daily. Use sparingly on affected areas.   [DISCONTINUED] cyclobenzaprine (FLEXERIL) 5 MG tablet 1 tablet every 8 hours as he did for muscle spasms   [DISCONTINUED] HYDROcodone-acetaminophen (NORCO) 5-325 MG tablet Take 1 tablet by mouth every 6 (six) hours as needed for moderate pain.   [DISCONTINUED] ibuprofen (ADVIL) 800 MG tablet Take 1 tablet (800 mg total) by mouth every 8 (eight) hours as needed for moderate pain.   [DISCONTINUED] methylPREDNISolone (MEDROL DOSEPAK) 4 MG TBPK tablet Take as directed   [DISCONTINUED] ondansetron (ZOFRAN ODT) 4 MG disintegrating tablet Take 1 tablet (4 mg total) by mouth every 6 (six) hours as needed for nausea or vomiting.   [DISCONTINUED] tamsulosin (FLOMAX) 0.4 MG CAPS capsule Take 1  capsule (0.4 mg total) by mouth daily. Take until stone passes.   No facility-administered medications prior to visit.   No Known Allergies  Immunization History  Administered Date(s) Administered   Tdap 04/29/2021    Health Maintenance  Topic Date Due   COVID-19 Vaccine (1) Never done   INFLUENZA VACCINE  10/21/2022   DTaP/Tdap/Td (2 - Td or Tdap) 04/30/2031   Hepatitis C Screening  Completed   HIV Screening  Completed   HPV VACCINES  Aged Out    Patient Care Team: Thomas Rhude, Monico Blitz, DO as PCP - General (Family Medicine)  Review of Systems  Constitutional:  Negative for chills, fatigue, fever and unexpected  weight change.  HENT:  Positive for dental problem. Negative for congestion and sneezing (except last month; had been out fishing).   Eyes:  Negative for visual disturbance.  Respiratory:  Negative for chest tightness and shortness of breath.   Cardiovascular:  Negative for chest pain, palpitations and leg swelling.  Gastrointestinal:  Negative for abdominal pain, constipation, diarrhea, nausea and vomiting.  Endocrine: Negative for polydipsia, polyphagia and polyuria.  Genitourinary:  Negative for difficulty urinating, dysuria, frequency, hematuria, penile discharge, penile swelling, scrotal swelling, testicular pain and urgency.  Musculoskeletal:  Negative for arthralgias, joint swelling and myalgias.  Skin:  Positive for rash.  Allergic/Immunologic: Positive for environmental allergies.  Neurological:  Negative for dizziness, tremors, weakness, light-headedness, numbness and headaches.  Psychiatric/Behavioral:  The patient is not nervous/anxious.        Objective    BP 113/75   Pulse (!) 54   Ht 5\' 11"  (1.803 m)   Wt 161 lb (73 kg)   SpO2 100%   BMI 22.45 kg/m    Physical Exam Vitals reviewed.  Constitutional:      General: He is not in acute distress.    Appearance: Normal appearance. He is well-developed. He is not diaphoretic.  HENT:     Head: Normocephalic and atraumatic.      Right Ear: External ear normal. There is impacted cerumen (encouraged patient to do a second four days of debrox as his last effort was approx. one month ago.).     Left Ear: Tympanic membrane, ear canal and external ear normal.     Nose: Nose normal.     Right Turbinates: Swollen.     Left Turbinates: Swollen.     Mouth/Throat:     Mouth: Mucous membranes are moist.     Pharynx: Oropharynx is clear. No oropharyngeal exudate.  Eyes:     General: No scleral icterus.    Conjunctiva/sclera: Conjunctivae normal.     Pupils: Pupils are equal, round, and reactive to light.  Neck:     Thyroid:  No thyromegaly.  Cardiovascular:     Rate and Rhythm: Normal rate and regular rhythm.     Pulses: Normal pulses.     Heart sounds: Normal heart sounds. No murmur heard. Pulmonary:     Effort: Pulmonary effort is normal. No respiratory distress.     Breath sounds: Normal breath sounds. No wheezing, rhonchi or rales.  Abdominal:     General: There is no distension.     Palpations: Abdomen is soft.     Tenderness: There is no abdominal tenderness.  Musculoskeletal:        General: No deformity.     Cervical back: Neck supple.     Right lower leg: No edema.     Left lower leg: No edema.  Lymphadenopathy:  Cervical: No cervical adenopathy.  Skin:    General: Skin is warm and dry.     Findings: No rash.  Neurological:     Mental Status: He is alert and oriented to person, place, and time. Mental status is at baseline.     Gait: Gait normal.  Psychiatric:        Mood and Affect: Mood normal.        Behavior: Behavior normal.        Thought Content: Thought content normal.     Depression Screen    09/08/2022    2:57 PM  PHQ 2/9 Scores  PHQ - 2 Score 1  PHQ- 9 Score 1   Results for orders placed or performed in visit on 09/08/22  Comprehensive metabolic panel  Result Value Ref Range   Glucose 79 70 - 99 mg/dL   BUN 10 6 - 24 mg/dL   Creatinine, Ser 1.61 0.76 - 1.27 mg/dL   eGFR 87 >09 UE/AVW/0.98   BUN/Creatinine Ratio 9 9 - 20   Sodium 140 134 - 144 mmol/L   Potassium 4.4 3.5 - 5.2 mmol/L   Chloride 102 96 - 106 mmol/L   CO2 25 20 - 29 mmol/L   Calcium 9.5 8.7 - 10.2 mg/dL   Total Protein 7.4 6.0 - 8.5 g/dL   Albumin 4.4 4.1 - 5.1 g/dL   Globulin, Total 3.0 1.5 - 4.5 g/dL   Bilirubin Total 0.8 0.0 - 1.2 mg/dL   Alkaline Phosphatase 78 44 - 121 IU/L   AST 20 0 - 40 IU/L   ALT 16 0 - 44 IU/L  Lipid panel  Result Value Ref Range   Cholesterol, Total 124 100 - 199 mg/dL   Triglycerides 39 0 - 149 mg/dL   HDL 52 >11 mg/dL   VLDL Cholesterol Cal 10 5 - 40 mg/dL    LDL Chol Calc (NIH) 62 0 - 99 mg/dL   Chol/HDL Ratio 2.4 0.0 - 5.0 ratio  TSH Rfx on Abnormal to Free T4  Result Value Ref Range   TSH 1.250 0.450 - 4.500 uIU/mL  HIV Antibody (routine testing w rflx)  Result Value Ref Range   HIV Screen 4th Generation wRfx Non Reactive Non Reactive  HCV Ab w Reflex to Quant PCR  Result Value Ref Range   HCV Ab Non Reactive Non Reactive  PSA Total (Reflex To Free)  Result Value Ref Range   Prostate Specific Ag, Serum 1.2 0.0 - 4.0 ng/mL   Reflex Criteria Comment   Interpretation:  Result Value Ref Range   HCV Interp 1: Comment     Assessment & Plan     1. Establishing care with new doctor, encounter for  2. Annual physical exam Patient here for annual exam today.  Physical exam overall benign except as noted.  Will order routine lab work and screening tests as noted below. - Comprehensive metabolic panel - Lipid panel - TSH Rfx on Abnormal to Free T4 - HIV Antibody (routine testing w rflx) - HCV Ab w Reflex to Quant PCR - PSA Total (Reflex To Free)  3. Seasonal allergies Discussed that patient can try allergy medication prior to seasons that exacerbate his allergies.  Patient was amenable to this.  Prescribed fexofenadine as noted below. - fexofenadine (ALLEGRA) 180 MG tablet; Take 1 tablet (180 mg total) by mouth daily.  Dispense: 90 tablet; Refill: 3  4. Eczema, unspecified type Patient has persistent eczema, which he has previously used clobetasol cream for.  He also recently tried his mother's CeraVe lotion which did help.  Prescribed both of these as noted below.  Advised patient to use the clobetasol very sparingly in regard to his face, as he does have eczematous lesions there. - clobetasol cream (TEMOVATE) 0.05 %; Apply 1 Application topically 2 (two) times daily. Apply SPARINGLY  Dispense: 60 g; Refill: 1 - Emollient (CERAVE DAILY MOISTURIZING) LOTN; Apply 1 Application topically in the morning and at bedtime.  Dispense: 355 mL;  Refill: 0  5. Chronic sinus bradycardia Patient exhibits chronic sinus bradycardia without symptoms.  Continue to monitor.  6. History of nephrolithiasis No recent concerns.  Continue to monitor.  7. Nicotine dependence with current use Patient is interested in attempting to quit smoking.  Based on the level he smokes, it is possible the 21 mg patch was too much nicotine for him.  Will go ahead and prescribe a 14 mg patch.      A barrier to success is that the patient's partner also smokes.  Advised him to discuss smoking cessation with her and encouraged her to either quit or ask her to smoke outside of their home to help him quit. - nicotine (NICODERM CQ - DOSED IN MG/24 HOURS) 14 mg/24hr patch; Place 1 patch (14 mg total) onto the skin daily.  Dispense: 28 patch; Refill: 0 - Ambulatory referral to Smoking Cessation Program  8. Needs assistance with dental care Will refer patient to dental care.  However, advised him that he may have to call dentist office directly to see if they take his insurance.  Also advised him that, if he calls the number on the back of his dental insurance card, they can help to advise him of which offices may be viable. - Ambulatory referral to Dentistry  9. Colon cancer screening - Ambulatory referral to Gastroenterology   Return in about 6 months (around 03/10/2023) for CKD/labs.     The entirety of the information documented in the History of Present Illness, Review of Systems and Physical Exam were personally obtained by me. Portions of this information were initially documented by the CMA, Erie Noe Vital, and reviewed by me for thoroughness and accuracy.   I discussed the assessment and treatment plan with the patient  The patient was provided an opportunity to ask questions and all were answered. The patient agreed with the plan and demonstrated an understanding of the instructions.   The patient was advised to call back or seek an in-person evaluation if  the symptoms worsen or if the condition fails to improve as anticipated.    Sherlyn Hay, DO  [facility] [phone] (phone) [phone] (fax)  [facility]

## 2022-09-08 ENCOUNTER — Encounter: Payer: Self-pay | Admitting: Family Medicine

## 2022-09-08 ENCOUNTER — Ambulatory Visit (INDEPENDENT_AMBULATORY_CARE_PROVIDER_SITE_OTHER): Payer: BC Managed Care – PPO | Admitting: Family Medicine

## 2022-09-08 VITALS — BP 113/75 | HR 54 | Ht 71.0 in | Wt 161.0 lb

## 2022-09-08 DIAGNOSIS — R001 Bradycardia, unspecified: Secondary | ICD-10-CM

## 2022-09-08 DIAGNOSIS — Z87442 Personal history of urinary calculi: Secondary | ICD-10-CM

## 2022-09-08 DIAGNOSIS — L309 Dermatitis, unspecified: Secondary | ICD-10-CM

## 2022-09-08 DIAGNOSIS — Z0000 Encounter for general adult medical examination without abnormal findings: Secondary | ICD-10-CM

## 2022-09-08 DIAGNOSIS — J302 Other seasonal allergic rhinitis: Secondary | ICD-10-CM | POA: Diagnosis not present

## 2022-09-08 DIAGNOSIS — Z1211 Encounter for screening for malignant neoplasm of colon: Secondary | ICD-10-CM

## 2022-09-08 DIAGNOSIS — F17200 Nicotine dependence, unspecified, uncomplicated: Secondary | ICD-10-CM

## 2022-09-08 DIAGNOSIS — Z741 Need for assistance with personal care: Secondary | ICD-10-CM

## 2022-09-08 DIAGNOSIS — Z7689 Persons encountering health services in other specified circumstances: Secondary | ICD-10-CM

## 2022-09-08 MED ORDER — NICOTINE 14 MG/24HR TD PT24
14.0000 mg | MEDICATED_PATCH | Freq: Every day | TRANSDERMAL | 0 refills | Status: AC
Start: 2022-09-08 — End: ?

## 2022-09-08 MED ORDER — CLOBETASOL PROPIONATE 0.05 % EX CREA
1.0000 | TOPICAL_CREAM | Freq: Two times a day (BID) | CUTANEOUS | 1 refills | Status: DC
Start: 2022-09-08 — End: 2023-09-09

## 2022-09-08 MED ORDER — FEXOFENADINE HCL 180 MG PO TABS
180.0000 mg | ORAL_TABLET | Freq: Every day | ORAL | 3 refills | Status: AC
Start: 2022-09-08 — End: ?

## 2022-09-08 MED ORDER — CERAVE DAILY MOISTURIZING EX LOTN
1.0000 | TOPICAL_LOTION | Freq: Two times a day (BID) | CUTANEOUS | 0 refills | Status: DC
Start: 2022-09-08 — End: 2023-09-09

## 2022-09-09 LAB — COMPREHENSIVE METABOLIC PANEL
ALT: 16 IU/L (ref 0–44)
AST: 20 IU/L (ref 0–40)
Albumin: 4.4 g/dL (ref 4.1–5.1)
Alkaline Phosphatase: 78 IU/L (ref 44–121)
BUN/Creatinine Ratio: 9 (ref 9–20)
BUN: 10 mg/dL (ref 6–24)
Bilirubin Total: 0.8 mg/dL (ref 0.0–1.2)
CO2: 25 mmol/L (ref 20–29)
Calcium: 9.5 mg/dL (ref 8.7–10.2)
Chloride: 102 mmol/L (ref 96–106)
Creatinine, Ser: 1.08 mg/dL (ref 0.76–1.27)
Globulin, Total: 3 g/dL (ref 1.5–4.5)
Glucose: 79 mg/dL (ref 70–99)
Potassium: 4.4 mmol/L (ref 3.5–5.2)
Sodium: 140 mmol/L (ref 134–144)
Total Protein: 7.4 g/dL (ref 6.0–8.5)
eGFR: 87 mL/min/{1.73_m2} (ref >59)

## 2022-09-09 LAB — HIV ANTIBODY (ROUTINE TESTING W REFLEX): HIV Screen 4th Generation wRfx: NONREACTIVE

## 2022-09-09 LAB — LIPID PANEL
Chol/HDL Ratio: 2.4 ratio (ref 0.0–5.0)
Cholesterol, Total: 124 mg/dL (ref 100–199)
HDL: 52 mg/dL (ref >39)
LDL Chol Calc (NIH): 62 mg/dL (ref 0–99)
Triglycerides: 39 mg/dL (ref 0–149)
VLDL Cholesterol Cal: 10 mg/dL (ref 5–40)

## 2022-09-09 LAB — HCV AB W REFLEX TO QUANT PCR: HCV Ab: NONREACTIVE

## 2022-09-09 LAB — PSA TOTAL (REFLEX TO FREE): Prostate Specific Ag, Serum: 1.2 ng/mL (ref 0.0–4.0)

## 2022-09-09 LAB — TSH RFX ON ABNORMAL TO FREE T4: TSH: 1.25 u[IU]/mL (ref 0.450–4.500)

## 2022-09-09 LAB — HCV INTERPRETATION

## 2022-09-15 ENCOUNTER — Encounter: Payer: Self-pay | Admitting: *Deleted

## 2022-09-15 NOTE — Addendum Note (Signed)
Addended by: Jacquenette Shone on: 09/15/2022 09:18 PM   Modules accepted: Level of Service

## 2022-10-05 ENCOUNTER — Emergency Department
Admission: EM | Admit: 2022-10-05 | Discharge: 2022-10-05 | Disposition: A | Discharge status: Home / Self Care | Payer: BC Managed Care – PPO | Attending: Emergency Medicine | Admitting: Emergency Medicine

## 2022-10-05 ENCOUNTER — Other Ambulatory Visit: Payer: Self-pay

## 2022-10-05 DIAGNOSIS — K0889 Other specified disorders of teeth and supporting structures: Secondary | ICD-10-CM | POA: Insufficient documentation

## 2022-10-05 MED ORDER — AMOXICILLIN-POT CLAVULANATE 875-125 MG PO TABS: 1.0000 | ORAL_TABLET | Freq: Two times a day (BID) | ORAL | 0 refills | Status: AC

## 2022-10-05 MED ORDER — AMOXICILLIN-POT CLAVULANATE 875-125 MG PO TABS
1.0000 | ORAL_TABLET | Freq: Once | ORAL | Status: AC
  Administered 2022-10-05: 1 via ORAL
  Filled 2022-10-05: qty 1

## 2022-10-05 NOTE — ED Provider Notes (Signed)
[facility] Provider Note  Patient Contact: 9:39 PM (approximate)   History   Dental Pain   HPI  Jake Lee is a 42 y.o. male presents to the emergency department with a broken tooth as superior 13.  Patient is able to maintain his own secretions and can speak in complete sentences.  Patient has not made an appointment with a local dentist.  No swelling of the upper and lower jaw. No other alleviating measures have been attempted.       Physical Exam   Triage Vital Signs: ED Triage Vitals [10/05/22 1845]  Encounter Vitals Group     BP (!) 159/94     Systolic BP Percentile      Diastolic BP Percentile      Pulse Rate (!) 48     Resp 18     Temp 98.5 F (36.9 C)     Temp Source Oral     SpO2 100 %     Weight 160 lb 15 oz (73 kg)     Height 5\' 11"  (1.803 m)     Head Circumference      Peak Flow      Pain Score 10     Pain Loc      Pain Education      Exclude from Growth Chart     Most recent vital signs: Vitals:   10/05/22 1845  BP: (!) 159/94  Pulse: (!) 48  Resp: 18  Temp: 98.5 F (36.9 C)  SpO2: 100%     General: Alert and in no acute distress. Eyes:  PERRL. EOMI. Head: No acute traumatic findings ENT:      Nose: No congestion/rhinnorhea.      Mouth/Throat: Mucous membranes are moist.  Patient has broken sleep. 13. Neck: No stridor. No cervical spine tenderness to palpation. Cardiovascular:  Good peripheral perfusion Respiratory: Normal respiratory effort without tachypnea or retractions. Lungs CTAB. Good air entry to the bases with no decreased or absent breath sounds. Gastrointestinal: Bowel sounds 4 quadrants. Soft and nontender to palpation. No guarding or rigidity. No palpable masses. No distention. No CVA tenderness. Musculoskeletal: Full range of motion to all extremities.  Neurologic:  No gross focal neurologic deficits are appreciated.  Skin:   No rash noted    ED Results / Procedures / Treatments    Labs (all labs ordered are listed, but only abnormal results are displayed) Labs Reviewed - No data to display      PROCEDURES:  Critical Care performed: No  Procedures   MEDICATIONS ORDERED IN ED: Medications  amoxicillin-clavulanate (AUGMENTIN) 875-125 MG per tablet 1 tablet (has no administration in time range)     IMPRESSION / MDM / ASSESSMENT AND PLAN / ED COURSE  I reviewed the triage vital signs and the nursing notes.                              Assessment and plan Dental pain 42 year old male presents to the emergency department with dental pain from a broken tooth, superior 13.  I recommended oral Augmentin and follow-up with a local dentist.  Tylenol and ibuprofen alternating were recommended for discomfort.  All patient questions were answered.     FINAL CLINICAL IMPRESSION(S) / ED DIAGNOSES   Final diagnoses:  Pain, dental     Rx / DC Orders   ED Discharge Orders          Ordered  amoxicillin-clavulanate (AUGMENTIN) 875-125 MG tablet  2 times daily        10/05/22 2135             Note:  This document was prepared using Dragon voice recognition software and may include unintentional dictation errors.   Pia Mau Floris, Cordelia Poche 10/05/22 2143    Jene Every, MD 10/09/22 [phone]

## 2022-10-05 NOTE — Discharge Instructions (Addendum)
Take Augmentin twice daily for the next seven days.  OPTIONS FOR DENTAL FOLLOW UP CARE  [place] [facility] - Local Safety Net Dental Clinics [email or web address]   [facility] [phone])  Sh[facility] Ma [phone])  [place] [phone] ext [phone])  [facility] [phone])  [facility] [phone]) This clinic caters to the indigent population and is on a lottery system. Location: [employer] [facility], [employer], [address], [place] Clinic Hours: Wednesdays from 6pm - 9pm, patients seen by a lottery system. For dates, call or go to [email or web address] Services: Cleanings, fillings and simple extractions. Payment Options: DENTAL WORK IS FREE OF CHARGE. Bring proof of income or support. Best way to get seen: Arrive at 5:15 pm - this is a lottery, NOT first come/first serve, so arriving earlier will not increase your chances of being seen.     [facility] [phone] Select option 1 for emergencies   Location: [facility], [place], [address], [place] Clinic Hours: No walk-ins accepted - call the day before to schedule an appointment. Check in times are 9:30 am and 1:30 pm. Services: Simple extractions, temporary fillings, pulpectomy/pulp debridement, uncomplicated abscess drainage. Payment Options: PAYMENT IS DUE AT THE TIME OF SERVICE.  Fee is usually $100-200, additional surgical procedures (e.g. abscess drainage) may be extra. Cash, checks, Visa/MasterCard accepted.  Can file Medicaid if patient is covered for dental - patient should call case worker to check. No discount for [facility] patients. Best way to get seen: MUST call the day before and get onto the schedule. Can usually be seen the next 1-2 days. No walk-ins accepted.      [facility] [phone]   Location: [facility], [address] [place] Clinic Hours: M, W, Th, F 8am or 1:30pm, Tues 9a or 1:30 - first come/first served. Services: Simple extractions, temporary fillings, uncomplicated abscess drainage.  You do not need to be an [facility] resident. Payment Options: PAYMENT IS DUE AT THE TIME OF SERVICE. Dental insurance, otherwise sliding scale - bring proof of income or support. Depending on income and treatment needed, cost is usually $50-200. Best way to get seen: Arrive early as it is first come/first served.     [facility] [phone]   Location: [address] Clinic Hours: Mon-Thu 8a-5p Services: Most basic dental services including extractions and fillings. Payment Options: PAYMENT IS DUE AT THE TIME OF SERVICE. Sliding scale, up to 50% off - bring proof if income or support. Medicaid with dental option accepted. Best way to get seen: Call to schedule an appointment, can usually be seen within 2 weeks OR they will try to see walk-ins - show up at 8a or 2p (you may have to wait).     [facility] [phone] [place] RESIDENTS ONLY   Location: [facility] [place] [facility], [address], [place], [place] [address] Clinic Hours: By appointment only. Monday - Thursday 8am-5pm, Friday 8am-12pm Services: Cleanings, fillings, extractions. Payment Options: PAYMENT IS DUE AT THE TIME OF SERVICE. Cash, Visa or MasterCard. Sliding scale - $30 minimum per service. Best way to get seen: Come in to office, complete packet and make an appointment - need proof of income or support monies for each household member and proof of [facility] residence. Usually takes about a month to get in.     [facility]  Services Dental Clinic [phone]   Location: [address] [facility] Hours: Walk-in Urgent Care  Dental Services are offered Monday-Friday mornings only. The numbers of emergencies accepted daily is limited to the number of providers available. Maximum 15 - Mondays, Wednesdays & Thursdays Maximum 10 - Tuesdays & Fridays Services: You do not need to be a [facility] resident to be seen for a dental emergency. Emergencies are defined as pain, swelling, abnormal bleeding, or dental trauma. Walkins will receive x-rays if needed. NOTE: Dental cleaning is not an emergency. Payment Options: PAYMENT IS DUE AT THE TIME OF SERVICE. Minimum co-pay is $40.00 for uninsured patients. Minimum co-pay is $3.00 for Medicaid with dental coverage. Dental Insurance is accepted and must be presented at time of visit. Medicare does not cover dental. Forms of payment: Cash, credit card, checks. Best way to get seen: If not previously registered with the clinic, walk-in dental registration begins at 7:15 am and is on a first come/first serve basis. If previously registered with the clinic, call to make an appointment.     [facility] [phone] [place] RESIDENTS ONLY   Location: [address] [place], [place] Clinic Hours: Mon-Thu 10a-2p Services: Extractions only! Payment Options: FREE (donations accepted) - bring proof of income or support Best way to get seen: Call and schedule an appointment OR come at 8am on the 1st Monday of every month (except for holidays) when it is first come/first served.     [facility] [phone]   Location: [address] [place], [place] Clinic Hours: Friday mornings Services, Payment Options, Best way to get seen: Call for info

## 2022-10-05 NOTE — ED Triage Notes (Signed)
Pt here with left top dental pain since this morning. Pt states pain is severe. Pt ambulatory to triage.

## 2022-10-14 DIAGNOSIS — F17200 Nicotine dependence, unspecified, uncomplicated: Secondary | ICD-10-CM | POA: Diagnosis not present

## 2022-10-26 IMAGING — CT CT MAXILLOFACIAL W/O CM
3 of 7 series · 15 of 47 positions shown, 18 images · non-contrast
Comparison: None.

CLINICAL DATA: Dizziness, facial trauma after fall today.

EXAM:
CT HEAD WITHOUT CONTRAST
CT MAXILLOFACIAL WITHOUT CONTRAST
TECHNIQUE: Multidetector CT imaging of the head and maxillofacial structures
were performed using the standard protocol without intravenous
contrast. Multiplanar CT image reconstructions of the maxillofacial
structures were also generated.
RADIATION DOSE REDUCTION: This exam was performed according to the
departmental dose-optimization program which includes automated
exposure control, adjustment of the mA and/or kV according to
patient size and/or use of iterative reconstruction technique.

[Series 2: head bone · axial · 0.43mm/px · z∈[-601,-485]mm · 10 of 72 slices shown, 13 images]
[im 7/72  brain]
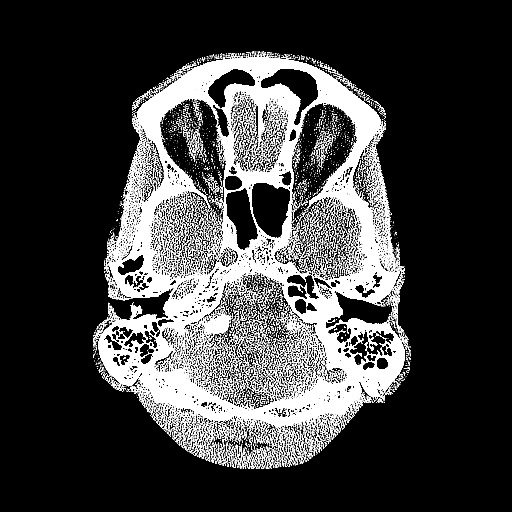
[im 7/72  bone]
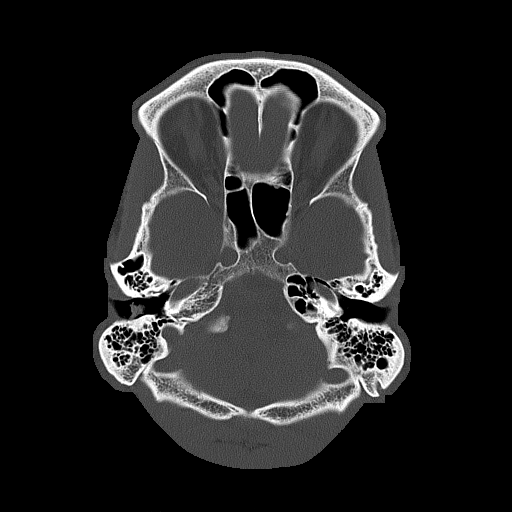
[im 13/72  bone]
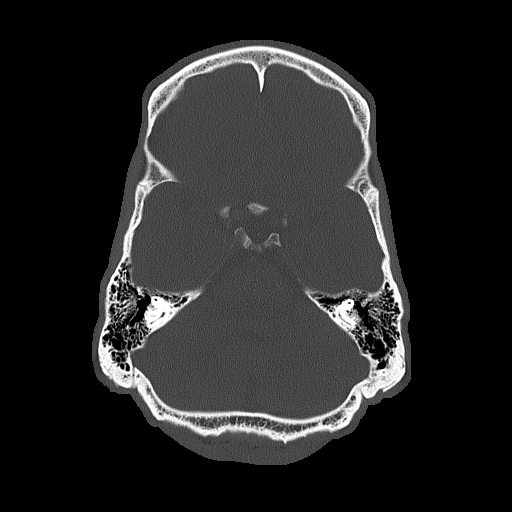
[im 20/72  bone]
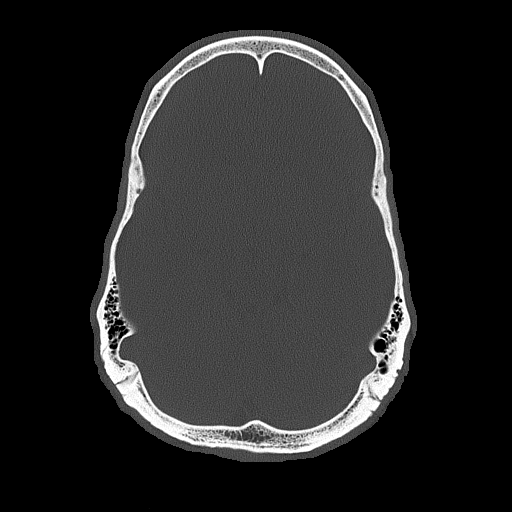
[im 26/72  bone]
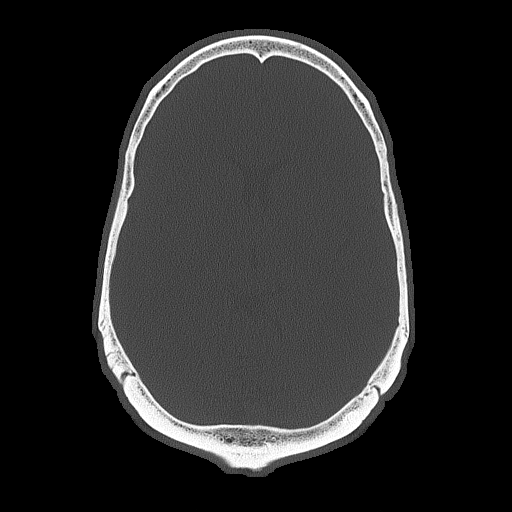
[im 33/72  brain]
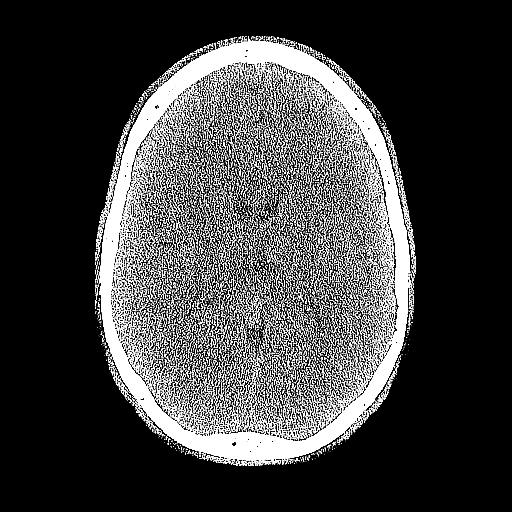
[im 33/72  bone]
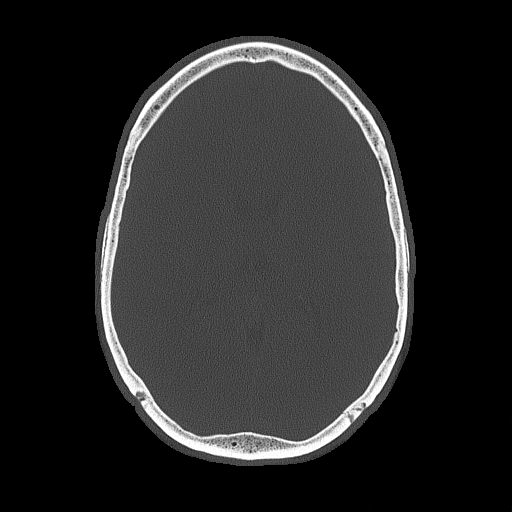
[im 39/72  bone]
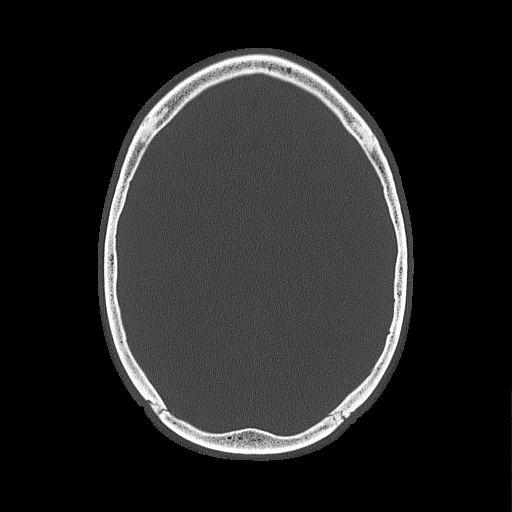
[im 46/72  bone]
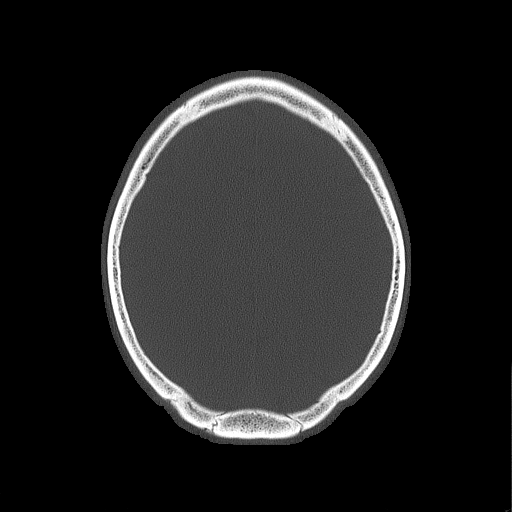
[im 52/72  bone]
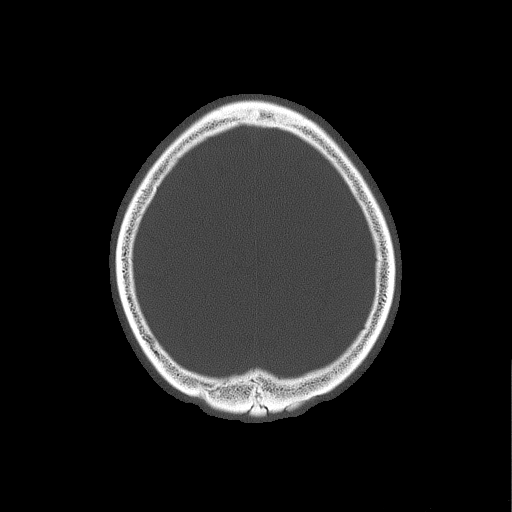
[im 59/72  brain]
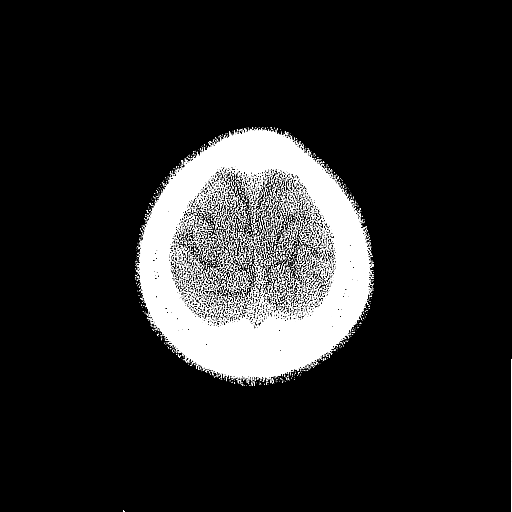
[im 59/72  bone]
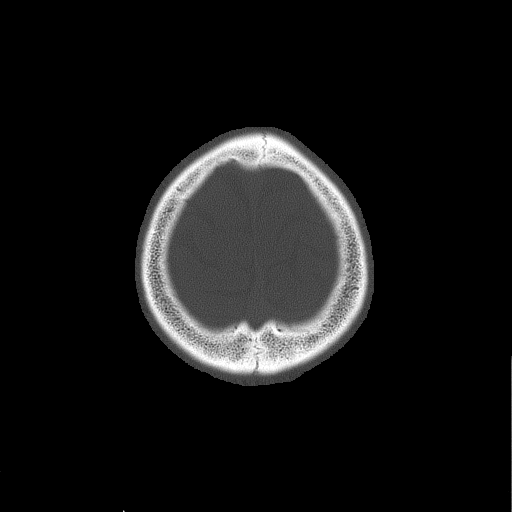
[im 65/72  bone]
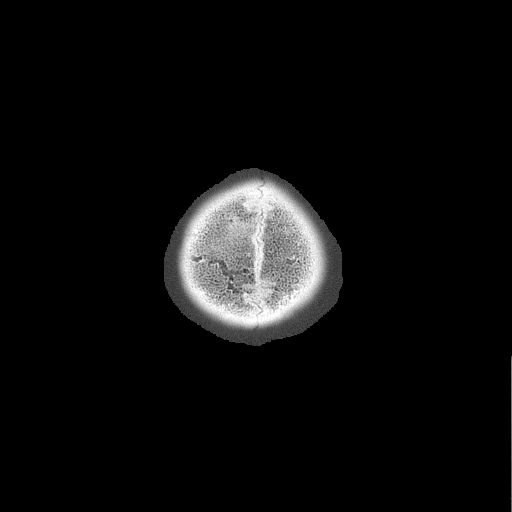

[Series 10: coronal soft · coronal · 0.32mm/px · 3 of 76 slices shown]
[im 64/76  bone]
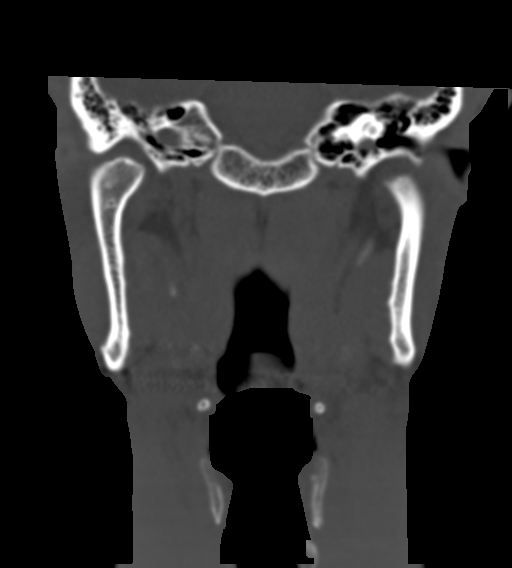
[im 67/76  bone]
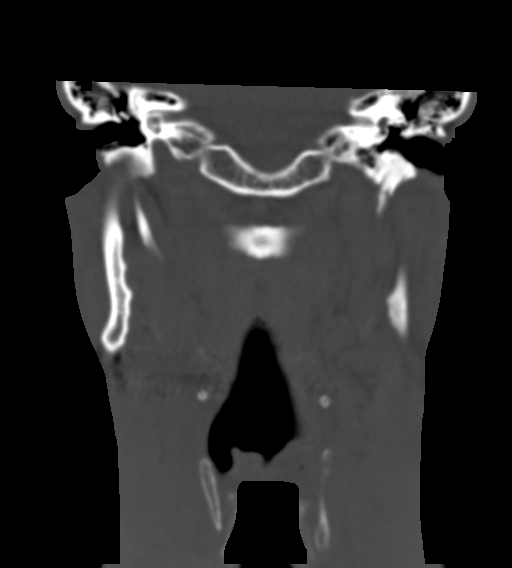
[im 70/76  bone]
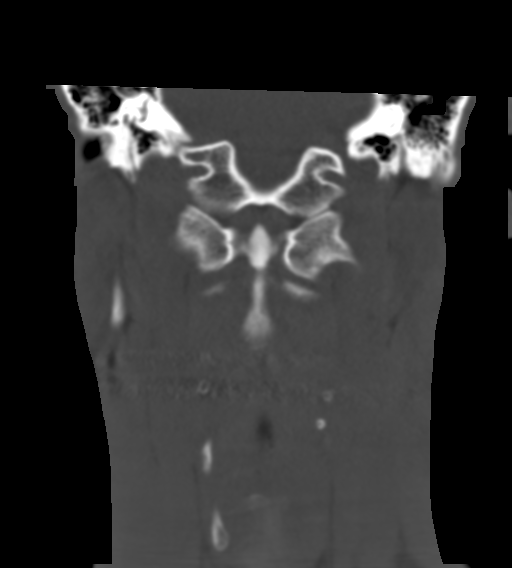

[Series 12: sagittal soft · sagittal · 0.30mm/px · 2 of 83 slices shown]
[im 28/83  bone]
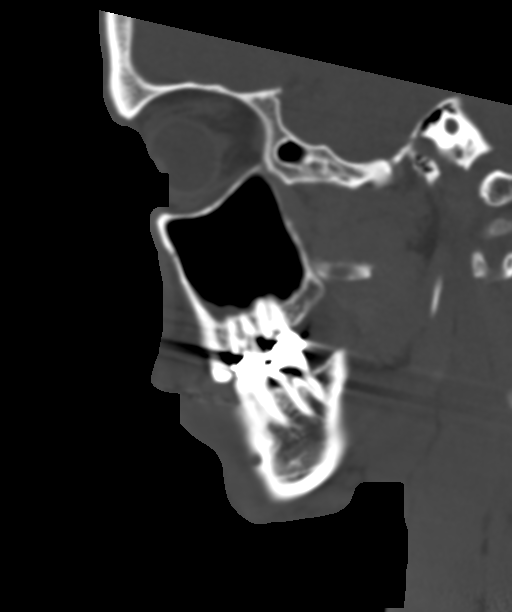
[im 55/83  bone]
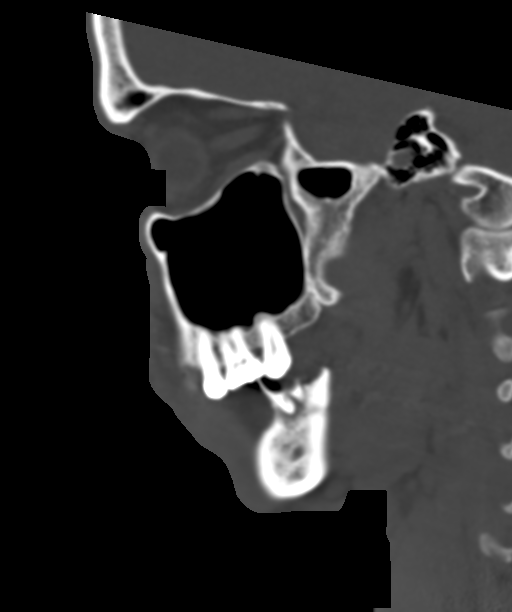

[15 of 47 positions shown; findings below may reference images not displayed]

FINDINGS: CT HEAD FINDINGS

Brain: No evidence of acute infarction, hemorrhage, hydrocephalus,
extra-axial collection or mass lesion/mass effect.

Vascular: No hyperdense vessel or unexpected calcification.

Skull: Normal. Negative for fracture or focal lesion.

Other: None.

CT MAXILLOFACIAL FINDINGS

Osseous: No fracture or mandibular dislocation. No destructive
process.

Orbits: Negative. No traumatic or inflammatory finding.

Sinuses: Clear.

Soft tissues: There is noted a laceration in the soft tissues to the
left of the nasal bridge and anterior to the left orbit.
IMPRESSION: No acute intracranial abnormality seen.

Soft tissue laceration seen to the left of the nasal bridge and
anterior to left orbit. No osseous abnormality is seen in the
maxillofacial region.

## 2022-12-07 ENCOUNTER — Emergency Department
Admission: EM | Admit: 2022-12-07 | Discharge: 2022-12-07 | Disposition: A | Discharge status: Home / Self Care | Payer: BC Managed Care – PPO | Attending: Emergency Medicine | Admitting: Emergency Medicine

## 2022-12-07 ENCOUNTER — Other Ambulatory Visit: Payer: Self-pay

## 2022-12-07 ENCOUNTER — Encounter: Payer: Self-pay | Admitting: Emergency Medicine

## 2022-12-07 ENCOUNTER — Emergency Department: Payer: BC Managed Care – PPO

## 2022-12-07 DIAGNOSIS — R509 Fever, unspecified: Secondary | ICD-10-CM | POA: Diagnosis not present

## 2022-12-07 DIAGNOSIS — R059 Cough, unspecified: Secondary | ICD-10-CM | POA: Diagnosis not present

## 2022-12-07 DIAGNOSIS — U071 COVID-19: Secondary | ICD-10-CM | POA: Diagnosis not present

## 2022-12-07 LAB — SARS CORONAVIRUS 2 BY RT PCR: SARS Coronavirus 2 by RT PCR: POSITIVE — AB

## 2022-12-07 MED ORDER — IBUPROFEN 600 MG PO TABS
600.0000 mg | ORAL_TABLET | Freq: Once | ORAL | Status: AC
  Administered 2022-12-07: 600 mg via ORAL
  Filled 2022-12-07: qty 1

## 2022-12-07 NOTE — ED Provider Notes (Signed)
[facility] Provider Note    Event Date/Time   First MD Initiated Contact with Patient 12/07/22 [phone]     (approximate)   History   Cough   HPI  Jake Lee is a 42 y.o. male   presents to the ED with complaint of cough, congestion, body aches and sweating for 1 week.  Patient also complains of chills today and has had fever.  Patient is unaware of any known sick contacts.  Patient has been taking over-the-counter medication without any relief.      Physical Exam   Triage Vital Signs: ED Triage Vitals  Encounter Vitals Group     BP 12/07/22 0813 132/87     Systolic BP Percentile --      Diastolic BP Percentile --      Pulse Rate 12/07/22 0813 72     Resp 12/07/22 0813 20     Temp 12/07/22 0813 (!) 100.6 F (38.1 C)     Temp Source 12/07/22 0813 Oral     SpO2 12/07/22 0813 96 %     Weight 12/07/22 0814 160 lb 15 oz (73 kg)     Height 12/07/22 0814 5\' 11"  (1.803 m)     Head Circumference --      Peak Flow --      Pain Score 12/07/22 0813 8     Pain Loc --      Pain Education --      Exclude from Growth Chart --     Most recent vital signs: Vitals:   12/07/22 0813 12/07/22 1046  BP: 132/87 130/80  Pulse: 72 70  Resp: 20 18  Temp: (!) 100.6 F (38.1 C) (!) 100.8 F (38.2 C)  SpO2: 96% 97%     General: Awake, no distress.  CV:  Good peripheral perfusion.  Heart regular rate and rhythm. Resp:  Normal effort.  Lungs clear bilaterally. Abd:  No distention.  Other:     ED Results / Procedures / Treatments   Labs (all labs ordered are listed, but only abnormal results are displayed) Labs Reviewed  SARS CORONAVIRUS 2 BY RT PCR - Abnormal; Notable for the following components:      Result Value   SARS Coronavirus 2 by RT PCR POSITIVE (*)    All other components within normal limits    RADIOLOGY  Chest x-ray images were reviewed by myself independent of the radiologist and no infiltrate was noted.  Official radiology report  shows no acute cardiopulmonary changes.   PROCEDURES:  Critical Care performed:   Procedures   MEDICATIONS ORDERED IN ED: Medications  ibuprofen (ADVIL) tablet 600 mg (600 mg Oral Given 12/07/22 1050)     IMPRESSION / MDM / ASSESSMENT AND PLAN / ED COURSE  I reviewed the triage vital signs and the nursing notes.   Differential diagnosis includes, but is not limited to, influenza, COVID, upper respiratory infection, pneumonia, bronchitis, viral illness.  42 year old male presents to the ED with complaint of upper respiratory symptoms for approximately 1 week.  Patient was made aware that he was positive for COVID and chest x-ray was reassuring and negative for pneumonia.  Patient is encouraged to drink fluids to stay hydrated and continue with Tylenol, ibuprofen and over-the-counter medications for comfort.  Patient has to follow-up with his PCP if any continued problems.    Patient's presentation is most consistent with acute complicated illness / injury requiring diagnostic workup.  FINAL CLINICAL IMPRESSION(S) / ED DIAGNOSES  Final diagnoses:  COVID     Rx / DC Orders   ED Discharge Orders     None        Note:  This document was prepared using Dragon voice recognition software and may include unintentional dictation errors.   Tommi Rumps, PA-C 12/07/22 1218    Sharman Cheek, MD 12/07/22 [phone]

## 2022-12-07 NOTE — ED Triage Notes (Signed)
Pt here with a cough, body aches, and sweating x1 week. Pt also having chills as well. Pt has fever today.

## 2022-12-07 NOTE — Discharge Instructions (Addendum)
Follow-up with your primary care provider if any continued problems.  Continue Tylenol or ibuprofen if needed for headache, body aches, fever.  Drink lots of fluids to stay hydrated.

## 2022-12-22 ENCOUNTER — Other Ambulatory Visit: Payer: Self-pay

## 2022-12-22 ENCOUNTER — Emergency Department
Admission: EM | Admit: 2022-12-22 | Discharge: 2022-12-22 | Disposition: A | Discharge status: Home / Self Care | Payer: BC Managed Care – PPO | Attending: Emergency Medicine | Admitting: Emergency Medicine

## 2022-12-22 DIAGNOSIS — H60393 Other infective otitis externa, bilateral: Secondary | ICD-10-CM | POA: Insufficient documentation

## 2022-12-22 DIAGNOSIS — H9203 Otalgia, bilateral: Secondary | ICD-10-CM | POA: Diagnosis not present

## 2022-12-22 DIAGNOSIS — H6121 Impacted cerumen, right ear: Secondary | ICD-10-CM | POA: Diagnosis not present

## 2022-12-22 MED ORDER — MUPIROCIN 2 % EX OINT
TOPICAL_OINTMENT | Freq: Once | CUTANEOUS | Status: AC
  Filled 2022-12-22: qty 22

## 2022-12-22 MED ORDER — HYDROCORTISONE 1 % EX OINT
TOPICAL_OINTMENT | Freq: Once | CUTANEOUS | Status: AC
  Filled 2022-12-22: qty 28.35

## 2022-12-22 NOTE — ED Provider Notes (Signed)
[facility] Emergency Department Provider Note     Event Date/Time   First MD Initiated Contact with Patient 12/22/22 2007     (approximate)   History   Otalgia   HPI  Jake Lee is a 42 y.o. male presents to the ED with bilateral ear discomfort as well as decreased hearing in the right ear.  Patient without symptoms onset after he was diagnosed with COVID 2 weeks prior.  He denies any fevers, chills, sweats, vision changes, paralysis, tinnitus, vertigo.  He does report that his job requires earplugs for hearing protection.  He has been noticing irritation to the ears in the interim.  Physical Exam   Triage Vital Signs: ED Triage Vitals  Encounter Vitals Group     BP 12/22/22 1914 118/84     Systolic BP Percentile --      Diastolic BP Percentile --      Pulse Rate 12/22/22 1914 68     Resp 12/22/22 1914 18     Temp 12/22/22 1914 98.2 F (36.8 C)     Temp src --      SpO2 12/22/22 1914 97 %     Weight 12/22/22 1914 175 lb (79.4 kg)     Height 12/22/22 1914 5\' 11"  (1.803 m)     Head Circumference --      Peak Flow --      Pain Score 12/22/22 1913 8     Pain Loc --      Pain Education --      Exclude from Growth Chart --     Most recent vital signs: Vitals:   12/22/22 1914  BP: 118/84  Pulse: 68  Resp: 18  Temp: 98.2 F (36.8 C)  SpO2: 97%    General Awake, no distress. NAD HEENT NCAT. PERRL. EOMI. No rhinorrhea. Mucous membranes are moist.  Bilateral TMs with irritation and edema noted at the tragus.  Some serous drainage noted.  Right TM is obscured by soft wax in the canal. CV:  Good peripheral perfusion.  RESP:  Normal effort.  ABD:  No distention.   ED Results / Procedures / Treatments   Labs (all labs ordered are listed, but only abnormal results are displayed) Labs Reviewed - No data to display   EKG   RADIOLOGY  No results found.   PROCEDURES:  Critical Care performed: No  .Ear Cerumen  Removal  Date/Time: 12/22/2022 9:52 PM  Performed by: Lissa Hoard, PA-C Authorized by: Lissa Hoard, PA-C   Consent:    Consent obtained:  Verbal   Consent given by:  Patient   Risks, benefits, and alternatives were discussed: yes     Risks discussed:  Bleeding, pain, TM perforation and incomplete removal   Alternatives discussed:  No treatment Universal protocol:    Site/side marked: yes     Patient identity confirmed:  Verbally with patient Procedure details:    Location:  R ear   Procedure type: irrigation     Procedure outcomes: cerumen removed   Post-procedure details:    Inspection:  Ear canal clear, TM intact and no bleeding   Hearing quality:  Normal   Procedure completion:  Tolerated well, no immediate complications    MEDICATIONS ORDERED IN ED: Medications  mupirocin ointment (BACTROBAN) 2 % ( Topical Given 12/22/22 2156)  hydrocortisone 1 % ointment ( Topical Given 12/22/22 2156)     IMPRESSION / MDM / ASSESSMENT AND PLAN / ED COURSE  I reviewed the triage vital signs and the nursing notes.                              Differential diagnosis includes, but is not limited to, otitis externa, AOM, chondrodermatitis, eczema exacerbation, cerumen impaction  Patient's presentation is most consistent with acute, uncomplicated illness.  Patient's diagnosis is consistent with right otalgia secondary to cerumen impaction and bilateral otitis externa secondary to ear plug use.  Patient consents to and tolerates a right ear irrigation procedure with significant improvement of his cerumen impaction jointed intact TM and resolved hearing loss.  Patient will be discharged home with prescriptions for Bactroban and hydrocortisone ointment. Patient is to follow up with [facility] as needed or otherwise directed. Patient is given ED precautions to return to the ED for any worsening or new symptoms.   FINAL CLINICAL IMPRESSION(S) / ED DIAGNOSES   Final  diagnoses:  Other infective acute otitis externa of both ears  Impacted cerumen of right ear     Rx / DC Orders   ED Discharge Orders     None        Note:  This document was prepared using Dragon voice recognition software and may include unintentional dictation errors.    Lissa Hoard, PA-C 12/22/22 2256    Corena Herter, MD 12/24/22 [phone]

## 2022-12-22 NOTE — ED Triage Notes (Signed)
Pt reports right side ear discomfort and being hard of hearing after being dx with covid aprox 2 weeks ago. Pt denies vision changes numbness or weakness.

## 2022-12-22 NOTE — Discharge Instructions (Addendum)
Use the antibiotic ointment and steroid cream as discussed.  Avoid use of earplugs until symptoms resolve.

## 2023-09-09 ENCOUNTER — Encounter: Payer: Self-pay | Admitting: Family Medicine

## 2023-09-09 ENCOUNTER — Ambulatory Visit: Payer: Self-pay | Admitting: Family Medicine

## 2023-09-09 VITALS — BP 108/73 | HR 68 | Resp 14 | Ht 71.0 in | Wt 154.6 lb

## 2023-09-09 DIAGNOSIS — Z130 Encounter for screening for diseases of the blood and blood-forming organs and certain disorders involving the immune mechanism: Secondary | ICD-10-CM

## 2023-09-09 DIAGNOSIS — F17200 Nicotine dependence, unspecified, uncomplicated: Secondary | ICD-10-CM | POA: Diagnosis not present

## 2023-09-09 DIAGNOSIS — Z13228 Encounter for screening for other metabolic disorders: Secondary | ICD-10-CM | POA: Diagnosis not present

## 2023-09-09 DIAGNOSIS — Z716 Tobacco abuse counseling: Secondary | ICD-10-CM

## 2023-09-09 DIAGNOSIS — Z23 Encounter for immunization: Secondary | ICD-10-CM | POA: Diagnosis not present

## 2023-09-09 DIAGNOSIS — Z1329 Encounter for screening for other suspected endocrine disorder: Secondary | ICD-10-CM | POA: Diagnosis not present

## 2023-09-09 DIAGNOSIS — L309 Dermatitis, unspecified: Secondary | ICD-10-CM | POA: Diagnosis not present

## 2023-09-09 DIAGNOSIS — Z136 Encounter for screening for cardiovascular disorders: Secondary | ICD-10-CM | POA: Diagnosis not present

## 2023-09-09 DIAGNOSIS — Z0001 Encounter for general adult medical examination with abnormal findings: Secondary | ICD-10-CM

## 2023-09-09 DIAGNOSIS — Z0000 Encounter for general adult medical examination without abnormal findings: Secondary | ICD-10-CM

## 2023-09-09 MED ORDER — CLOBETASOL PROPIONATE 0.05 % EX CREA: 1.0000 | TOPICAL_CREAM | Freq: Two times a day (BID) | CUTANEOUS | 1 refills | Status: AC

## 2023-09-09 MED ORDER — CERAVE DAILY MOISTURIZING EX LOTN
1.0000 | TOPICAL_LOTION | Freq: Two times a day (BID) | CUTANEOUS | 0 refills | Status: AC
Start: 2023-09-09 — End: ?

## 2023-09-09 NOTE — Progress Notes (Signed)
 Complete physical exam   Patient: Jake Lee   DOB: 30-Apr-1980   43 y.o. Male  MRN: [id number] Visit Date: 09/09/2023  Today's healthcare provider: LAURAINE LOISE BUOY, DO   Chief Complaint  Patient presents with   Annual Exam    Sleeping pattern: Good  Exercising: Walks daily, works 12 hours. No concerns   Subjective    Jake Lee is a 43 y.o. male who presents today for a complete physical exam.  He reports consuming a general diet but hasn't been eating as much as normal. He walks his dog daily. He generally feels well. He reports sleeping well. He does not have additional problems to discuss today.   HPI HPI     Annual Exam    Additional comments: Sleeping pattern: Good  Exercising: Walks daily, works 12 hours. No concerns      Last edited by Wilfred Hargis RAMAN, CMA on 09/09/2023 10:29 AM.      Jake Lee is a 43 year old male who presents for an annual physical exam.  He has been working long hours, sometimes up to twelve hours a day for two weeks out of the month, which has affected his eating habits as he has not been eating as much. 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 He walks his dog for exercise.  He continues to smoke three Black [employer] Mild cigars daily. He previously attempted to quit smoking using nicotine  patches and gum. The gum upset his stomach, leading him to discontinue its use. He has not tried varenicline or Wellbutrin due to a preference to avoid medications. He is not currently interested in retrying the patch or other smoking cessation aids.  He experienced a flare-up of a skin condition last week after using regular [employer] detergent instead of the sensitive version. He uses CeraVe, which has been effective in managing his symptoms for the past year.  He recalls a past episode of chills and vomiting that lasted two days, which he attributes to a flu-like illness last year.   He reports  recent ear pain after using new earplugs at work, which he has since stopped using. No runny nose, post-nasal drip, or seasonal allergies. He tries to stay indoors to avoid allergens.    Past Medical History:  Diagnosis Date   Kidney stone    Sinus bradycardia    History reviewed. No pertinent surgical history. Social History   Socioeconomic History   Marital status: Single    Spouse name: Not on file   Number of children: Not on file   Years of education: Not on file   Highest education level: Not on file  Occupational History   Not on file  Tobacco Use   Smoking status: Every Day    Current packs/day: 0.50    Types: Cigarettes   Smokeless tobacco: Never  Vaping Use   Vaping status: Not on file  Substance and Sexual Activity   Alcohol use: Not Currently   Drug use: Not Currently   Sexual activity: Yes    Birth control/protection: None  Other Topics Concern   Not on file  Social History Narrative   Not on file   Social Drivers of Health   Financial Resource Strain: Low Risk  (09/09/2023)   Overall Financial Resource Strain (CARDIA)    Difficulty of Paying Living Expenses: Not hard at all  Food Insecurity: No Food Insecurity (09/09/2023)   Hunger  Vital Sign    Worried About Programme researcher, broadcasting/film/video in the Last Year: Never true    Ran Out of Food in the Last Year: Never true  Transportation Needs: Not on file  Physical Activity: Sufficiently Active (09/09/2023)   Exercise Vital Sign    Days of Exercise per Week: 5 days    Minutes of Exercise per Session: 60 min  Stress: No Stress Concern Present (09/09/2023)   [employer] of Occupational Health - Occupational Stress Questionnaire    Feeling of Stress: Only a little  Social Connections: Not on file  Intimate Partner Violence: Not At Risk (09/09/2023)   Humiliation, Afraid, Rape, and Kick questionnaire    Fear of Current or Ex-Partner: No    Emotionally Abused: No    Physically Abused: No    Sexually Abused: No    No family status information on file.   History reviewed. No pertinent family history. No Known Allergies  Patient Care Team: Jehan Ranganathan N, DO as PCP - General (Family Medicine)   Medications: Outpatient Medications Prior to Visit  Medication Sig   fexofenadine  (ALLEGRA ) 180 MG tablet Take 1 tablet (180 mg total) by mouth daily.   nicotine  (NICODERM CQ  - DOSED IN MG/24 HOURS) 14 mg/24hr patch Place 1 patch (14 mg total) onto the skin daily.   [DISCONTINUED] clobetasol  cream (TEMOVATE ) 0.05 % Apply 1 Application topically 2 (two) times daily. Apply SPARINGLY   [DISCONTINUED] Emollient (CERAVE DAILY MOISTURIZING) LOTN Apply 1 Application topically in the morning and at bedtime.   No facility-administered medications prior to visit.    Review of Systems  Constitutional:  Negative for appetite change, chills, fatigue and fever.  HENT:  Negative for congestion, ear pain, hearing loss, nosebleeds and trouble swallowing.   Eyes:  Negative for pain and visual disturbance.  Respiratory:  Negative for cough, chest tightness and shortness of breath.   Cardiovascular:  Negative for chest pain, palpitations and leg swelling.  Gastrointestinal:  Negative for abdominal pain, blood in stool, constipation, diarrhea, nausea and vomiting.  Endocrine: Negative for polydipsia, polyphagia and polyuria.  Genitourinary:  Negative for dysuria and flank pain.  Musculoskeletal:  Negative for arthralgias, back pain, joint swelling, myalgias and neck stiffness.  Skin:  Positive for rash (right lateral neck, itchy.). Negative for color change and wound.  Neurological:  Negative for dizziness, tremors, seizures, speech difficulty, weakness, light-headedness and headaches.  Psychiatric/Behavioral:  Negative for behavioral problems, confusion, decreased concentration, dysphoric mood and sleep disturbance. The patient is not nervous/anxious.   All other systems reviewed and are negative.     Objective     BP 108/73 (BP Location: Right Arm, Patient Position: Sitting, Cuff Size: Normal)   Pulse 68   Resp 14   Ht 5' 11 (1.803 m)   Wt 154 lb 9.6 oz (70.1 kg)   SpO2 99%   BMI 21.56 kg/m    Physical Exam Vitals and nursing note reviewed.  Constitutional:      General: He is awake.     Appearance: Normal appearance.  HENT:     Head: Normocephalic and atraumatic.     Right Ear: Tympanic membrane, ear canal and external ear normal.     Left Ear: Tympanic membrane, ear canal and external ear normal.     Nose: Nose normal.     Mouth/Throat:     Mouth: Mucous membranes are moist.     Pharynx: Oropharynx is clear. No oropharyngeal exudate or posterior oropharyngeal erythema.   Eyes:  General: No scleral icterus.    Extraocular Movements: Extraocular movements intact.     Conjunctiva/sclera: Conjunctivae normal.     Pupils: Pupils are equal, round, and reactive to light.   Neck:     Thyroid : No thyromegaly or thyroid  tenderness.   Cardiovascular:     Rate and Rhythm: Normal rate and regular rhythm.     Pulses: Normal pulses.     Heart sounds: Normal heart sounds.  Pulmonary:     Effort: Pulmonary effort is normal. No tachypnea, bradypnea or respiratory distress.     Breath sounds: Normal breath sounds. No stridor. No wheezing, rhonchi or rales.  Abdominal:     General: Bowel sounds are normal. There is no distension.     Palpations: Abdomen is soft. There is no mass.     Tenderness: There is no abdominal tenderness. There is no guarding.     Hernia: No hernia is present.   Musculoskeletal:     Cervical back: Normal range of motion and neck supple.     Right lower leg: No edema.     Left lower leg: No edema.  Lymphadenopathy:     Cervical: No cervical adenopathy.   Skin:    General: Skin is warm and dry.     Findings: Rash (right lateral neck; scaley) present.   Neurological:     Mental Status: He is alert and oriented to person, place, and time. Mental status is at  baseline.   Psychiatric:        Mood and Affect: Mood normal.        Behavior: Behavior normal.       Last depression screening scores    09/09/2023   10:36 AM 09/08/2022    2:57 PM  PHQ 2/9 Scores  PHQ - 2 Score 0 1  PHQ- 9 Score 2 1   Last fall risk screening    09/09/2023   10:35 AM  Fall Risk   Falls in the past year? 0  Injury with Fall? 0  Risk for fall due to : No Fall Risks   Last Audit-C alcohol use screening    09/09/2023   10:31 AM  Alcohol Use Disorder Test (AUDIT)  1. How often do you have a drink containing alcohol? 0  2. How many drinks containing alcohol do you have on a typical day when you are drinking? 0  3. How often do you have six or more drinks on one occasion? 0  AUDIT-C Score 0   A score of 3 or more in women, and 4 or more in men indicates increased risk for alcohol abuse, EXCEPT if all of the points are from question 1   Results for orders placed or performed in visit on 09/09/23  Comprehensive metabolic panel with GFR  Result Value Ref Range   Glucose 56 (L) 70 - 99 mg/dL   BUN 10 6 - 24 mg/dL   Creatinine, Ser 8.93 0.76 - 1.27 mg/dL   eGFR 89 >40 fO/fpw/8.26   BUN/Creatinine Ratio 9 9 - 20   Sodium 143 134 - 144 mmol/L   Potassium 4.0 3.5 - 5.2 mmol/L   Chloride 105 96 - 106 mmol/L   CO2 22 20 - 29 mmol/L   Calcium 9.7 8.7 - 10.2 mg/dL   Total Protein 7.3 6.0 - 8.5 g/dL   Albumin 4.6 4.1 - 5.1 g/dL   Globulin, Total 2.7 1.5 - 4.5 g/dL   Bilirubin Total 0.8 0.0 - 1.2 mg/dL  Alkaline Phosphatase 72 44 - 121 IU/L   AST 16 0 - 40 IU/L   ALT 11 0 - 44 IU/L  Lipid panel  Result Value Ref Range   Cholesterol, Total 131 100 - 199 mg/dL   Triglycerides 51 0 - 149 mg/dL   HDL 47 >60 mg/dL   VLDL Cholesterol Cal 11 5 - 40 mg/dL   LDL Chol Calc (NIH) 73 0 - 99 mg/dL   Chol/HDL Ratio 2.8 0.0 - 5.0 ratio    Assessment & Plan    Routine Health Maintenance and Physical Exam  Exercise Activities and Dietary recommendations  Goals    None     Immunization History  Administered Date(s) Administered   HPV 9-valent 09/09/2023   PNEUMOCOCCAL CONJUGATE-20 09/09/2023   Tdap 04/29/2021    Health Maintenance  Topic Date Due   Hepatitis B Vaccines (1 of 3 - 19+ 3-dose series) Never done   HPV VACCINES (2 - 3-dose SCDM series) 10/07/2023   COVID-19 Vaccine (1 - 2024-25 season) 12/21/2023 (Originally 11/21/2022)   INFLUENZA VACCINE  10/21/2023   DTaP/Tdap/Td (2 - Td or Tdap) 04/30/2031   Pneumococcal Vaccine 22-75 Years old  Completed   Hepatitis C Screening  Completed   HIV Screening  Completed   Meningococcal B Vaccine  Aged Out    Discussed health benefits of physical activity, and encouraged him to engage in regular exercise appropriate for his age and condition.   Annual physical exam  Eczema, unspecified type -     CeraVe Daily Moisturizing; Apply 1 Application topically in the morning and at bedtime.  Dispense: 355 mL; Refill: 0 -     Clobetasol  Propionate; Apply 1 Application topically 2 (two) times daily. Apply SPARINGLY  Dispense: 60 g; Refill: 1 -     HPV 9-valent vaccine,Recombinat  Nicotine  dependence with current use  Encounter for smoking cessation counseling  Need for HPV vaccine  Encounter for Prevnar pneumococcal vaccination -     Pneumococcal conjugate vaccine 20-valent  Screening for endocrine, metabolic and immunity disorder -     Comprehensive metabolic panel with GFR  Encounter for screening for cardiovascular disorders -     Lipid panel     Annual physical exam Physical exam overall unremarkable except as noted above. Routine lab work ordered as noted. Considering COVID-19 booster, uncertain due to past infection and efficacy concerns. Physically active at work, lacks additional exercise. - Discussed COVID-19 booster availability and benefits. - Encouraged regular physical activity beyond work.  Nicotine  dependence; smoking cessation counseling Continues smoking three cigars  daily. Previous nicotine  replacement therapy failed due to side effects. Unwilling to retry patch or try varenicline or bupropion. - Educated on proper nicotine  gum use. - Encouraged regular gum for cravings. - Discussed nicotine  lozenges as an alternative.  Eczema Eczema flares due to non-sensitive laundry detergent. Managed with CeraVe. - Continue CeraVe. - Use sensitive skin laundry detergent.  Ear discomfort Discomfort from new earplugs, likely due to fit. - Recommend larger earplugs from employer.    Return in about 1 year (around 09/08/2024) for CPE.     I discussed the assessment and treatment plan with the patient  The patient was provided an opportunity to ask questions and all were answered. The patient agreed with the plan and demonstrated an understanding of the instructions.   The patient was advised to call back or seek an in-person evaluation if the symptoms worsen or if the condition fails to improve as anticipated.    [facility]  LOISE BUOY, DO  [facility] [phone] (phone) [phone] (fax)  [facility]

## 2023-09-10 LAB — LIPID PANEL
Chol/HDL Ratio: 2.8 ratio (ref 0.0–5.0)
Cholesterol, Total: 131 mg/dL (ref 100–199)
HDL: 47 mg/dL (ref >39)
LDL Chol Calc (NIH): 73 mg/dL (ref 0–99)
Triglycerides: 51 mg/dL (ref 0–149)
VLDL Cholesterol Cal: 11 mg/dL (ref 5–40)

## 2023-09-10 LAB — COMPREHENSIVE METABOLIC PANEL WITH GFR
ALT: 11 IU/L (ref 0–44)
AST: 16 IU/L (ref 0–40)
Albumin: 4.6 g/dL (ref 4.1–5.1)
Alkaline Phosphatase: 72 IU/L (ref 44–121)
BUN/Creatinine Ratio: 9 (ref 9–20)
BUN: 10 mg/dL (ref 6–24)
Bilirubin Total: 0.8 mg/dL (ref 0.0–1.2)
CO2: 22 mmol/L (ref 20–29)
Calcium: 9.7 mg/dL (ref 8.7–10.2)
Chloride: 105 mmol/L (ref 96–106)
Creatinine, Ser: 1.06 mg/dL (ref 0.76–1.27)
Globulin, Total: 2.7 g/dL (ref 1.5–4.5)
Glucose: 56 mg/dL — ABNORMAL LOW (ref 70–99)
Potassium: 4 mmol/L (ref 3.5–5.2)
Sodium: 143 mmol/L (ref 134–144)
Total Protein: 7.3 g/dL (ref 6.0–8.5)
eGFR: 89 mL/min/{1.73_m2} (ref >59)

## 2023-09-19 ENCOUNTER — Encounter: Payer: Self-pay | Admitting: Family Medicine

## 2023-09-19 ENCOUNTER — Ambulatory Visit: Payer: Self-pay | Admitting: Family Medicine

## 2023-10-10 ENCOUNTER — Other Ambulatory Visit: Payer: Self-pay

## 2023-10-10 ENCOUNTER — Emergency Department

## 2023-10-10 ENCOUNTER — Encounter: Payer: Self-pay | Admitting: Emergency Medicine

## 2023-10-10 ENCOUNTER — Emergency Department
Admission: EM | Admit: 2023-10-10 | Discharge: 2023-10-10 | Disposition: A | Discharge status: Home / Self Care | Attending: Emergency Medicine | Admitting: Emergency Medicine

## 2023-10-10 DIAGNOSIS — N3289 Other specified disorders of bladder: Secondary | ICD-10-CM | POA: Diagnosis not present

## 2023-10-10 DIAGNOSIS — N202 Calculus of kidney with calculus of ureter: Secondary | ICD-10-CM | POA: Insufficient documentation

## 2023-10-10 DIAGNOSIS — R109 Unspecified abdominal pain: Secondary | ICD-10-CM | POA: Diagnosis not present

## 2023-10-10 DIAGNOSIS — N200 Calculus of kidney: Secondary | ICD-10-CM

## 2023-10-10 DIAGNOSIS — N201 Calculus of ureter: Secondary | ICD-10-CM

## 2023-10-10 LAB — BASIC METABOLIC PANEL WITH GFR
Anion gap: 10 (ref 5–15)
BUN: 14 mg/dL (ref 6–20)
CO2: 28 mmol/L (ref 22–32)
Calcium: 9.6 mg/dL (ref 8.9–10.3)
Chloride: 104 mmol/L (ref 98–111)
Creatinine, Ser: 1.06 mg/dL (ref 0.61–1.24)
GFR, Estimated: >60 mL/min (ref >60)
Glucose, Bld: 87 mg/dL (ref 70–99)
Potassium: 4.4 mmol/L (ref 3.5–5.1)
Sodium: 142 mmol/L (ref 135–145)

## 2023-10-10 LAB — CBC
HCT: 44.8 % (ref 39.0–52.0)
Hemoglobin: 14.7 g/dL (ref 13.0–17.0)
MCH: 29.9 pg (ref 26.0–34.0)
MCHC: 32.8 g/dL (ref 30.0–36.0)
MCV: 91.2 fL (ref 80.0–100.0)
Platelets: 209 K/uL (ref 150–400)
RBC: 4.91 MIL/uL (ref 4.22–5.81)
RDW: 13.2 % (ref 11.5–15.5)
WBC: 6.5 K/uL (ref 4.0–10.5)
nRBC: 0 % (ref 0.0–0.2)

## 2023-10-10 LAB — URINALYSIS, ROUTINE W REFLEX MICROSCOPIC
Bilirubin Urine: NEGATIVE
Glucose, UA: NEGATIVE mg/dL
Ketones, ur: NEGATIVE mg/dL
Leukocytes,Ua: NEGATIVE
Nitrite: NEGATIVE
Protein, ur: NEGATIVE mg/dL
RBC / HPF: >50 RBC/hpf (ref 0–5)
Specific Gravity, Urine: 1.024 (ref 1.005–1.030)
pH: 6 (ref 5.0–8.0)

## 2023-10-10 MED ORDER — TAMSULOSIN HCL 0.4 MG PO CAPS
0.4000 mg | ORAL_CAPSULE | Freq: Every day | ORAL | Status: DC
  Administered 2023-10-10: 0.4 mg via ORAL
  Filled 2023-10-10: qty 1

## 2023-10-10 MED ORDER — ONDANSETRON HCL 4 MG/2ML IJ SOLN
4.0000 mg | Freq: Once | INTRAMUSCULAR | Status: AC
  Administered 2023-10-10: 4 mg via INTRAVENOUS
  Filled 2023-10-10: qty 2

## 2023-10-10 MED ORDER — ONDANSETRON 4 MG PO TBDP: 4.0000 mg | ORAL_TABLET | Freq: Three times a day (TID) | ORAL | 0 refills | Status: AC | PRN

## 2023-10-10 MED ORDER — OXYCODONE-ACETAMINOPHEN 5-325 MG PO TABS
1.0000 | ORAL_TABLET | Freq: Once | ORAL | Status: AC
  Administered 2023-10-10: 1 via ORAL
  Filled 2023-10-10: qty 1

## 2023-10-10 MED ORDER — KETOROLAC TROMETHAMINE 15 MG/ML IJ SOLN
15.0000 mg | Freq: Once | INTRAMUSCULAR | Status: AC
  Administered 2023-10-10: 15 mg via INTRAVENOUS
  Filled 2023-10-10: qty 1

## 2023-10-10 MED ORDER — TAMSULOSIN HCL 0.4 MG PO CAPS: 0.4000 mg | ORAL_CAPSULE | Freq: Every day | ORAL | 1 refills | Status: DC

## 2023-10-10 MED ORDER — SODIUM CHLORIDE 0.9 % IV BOLUS
1000.0000 mL | Freq: Once | INTRAVENOUS | Status: AC
  Administered 2023-10-10: 1000 mL via INTRAVENOUS

## 2023-10-10 MED ORDER — TAMSULOSIN HCL 0.4 MG PO CAPS: 0.4000 mg | ORAL_CAPSULE | Freq: Every day | ORAL | 1 refills | Status: AC

## 2023-10-10 MED ORDER — KETOROLAC TROMETHAMINE 10 MG PO TABS: 10.0000 mg | ORAL_TABLET | Freq: Three times a day (TID) | ORAL | 0 refills | Status: AC | PRN

## 2023-10-10 NOTE — ED Provider Notes (Signed)
 [facility] Emergency Department Provider Note     Event Date/Time   First MD Initiated Contact with Patient 10/10/23 1944     (approximate)   History   Flank Pain   HPI  Jake Lee is a 43 y.o. male presents to the ED for evaluation of sudden right flank pain with radiation into his right groin approximately 2 hours prior to ED arrival.  Associated symptoms includes nausea without vomiting.  Patient has a history of kidney stones and presentation today is similar.  Denies fever and chills.    Physical Exam   Triage Vital Signs: ED Triage Vitals  Encounter Vitals Group     BP 10/10/23 1745 (!) 152/102     Girls Systolic BP Percentile --      Girls Diastolic BP Percentile --      Boys Systolic BP Percentile --      Boys Diastolic BP Percentile --      Pulse Rate 10/10/23 1745 (!) 57     Resp 10/10/23 1745 18     Temp 10/10/23 1745 98.2 F (36.8 C)     Temp Source 10/10/23 1745 Oral     SpO2 10/10/23 1745 100 %     Weight 10/10/23 1746 170 lb (77.1 kg)     Height 10/10/23 1746 5' 11 (1.803 m)     Head Circumference --      Peak Flow --      Pain Score 10/10/23 1746 6     Pain Loc --      Pain Education --      Exclude from Growth Chart --     Most recent vital signs: Vitals:   10/10/23 1745 10/10/23 2112  BP: (!) 152/102 139/76  Pulse: (!) 57 (!) 46  Resp: 18 18  Temp: 98.2 F (36.8 C)   SpO2: 100% 100%    General Obvious discomfort.  Awake, no distress.  HEENT NCAT.  CV:  Good peripheral perfusion.  RESP:  Normal effort.  ABD:  No distention.  Other:  Tenderness to the right flank region.   ED Results / Procedures / Treatments   Labs (all labs ordered are listed, but only abnormal results are displayed) Labs Reviewed  URINALYSIS, ROUTINE W REFLEX MICROSCOPIC - Abnormal; Notable for the following components:      Result Value   Color, Urine YELLOW (*)    APPearance HAZY (*)    Hgb urine dipstick LARGE (*)     Bacteria, UA RARE (*)    All other components within normal limits  BASIC METABOLIC PANEL WITH GFR  CBC   RADIOLOGY  I personally viewed and evaluated these images as part of my medical decision making, as well as reviewing the written report by the radiologist.  ED Provider Interpretation: Renal calculus noted in the right kidney and distal right ureter.   CT Renal Stone Study Result Date: 10/10/2023 CLINICAL DATA:  Right flank pain. EXAM: CT ABDOMEN AND PELVIS WITHOUT CONTRAST TECHNIQUE: Multidetector CT imaging of the abdomen and pelvis was performed following the standard protocol without IV contrast. RADIATION DOSE REDUCTION: This exam was performed according to the departmental dose-optimization program which includes automated exposure control, adjustment of the mA and/or kV according to patient size and/or use of iterative reconstruction technique. COMPARISON:  April 27, 2020 FINDINGS: Lower chest: No acute abnormality. Hepatobiliary: No focal liver abnormality is seen. No gallstones, gallbladder wall thickening, or biliary dilatation. Pancreas: Unremarkable. No pancreatic ductal dilatation  or surrounding inflammatory changes. Spleen: Normal in size without focal abnormality. Adrenals/Urinary Tract: Adrenal glands are unremarkable. Kidneys are normal in size, without focal lesions. A 2 mm obstructing renal calculus is seen within the distal right ureter, near the right UVJ. 1 mm nonobstructing renal calculi are noted within the lower pole of the right kidney. The urinary bladder is poorly distended and subsequently limited in evaluation. Stomach/Bowel: Stomach is within normal limits. Appendix appears normal. No evidence of bowel wall thickening, distention, or inflammatory changes. Vascular/Lymphatic: No significant vascular findings are present. No enlarged abdominal or pelvic lymph nodes. Reproductive: Prostate is unremarkable. Other: No abdominal wall hernia or abnormality. No  abdominopelvic ascites. Musculoskeletal: No acute or significant osseous findings. IMPRESSION: 1. 2 mm obstructing renal calculus within the distal right ureter. 2. 1 mm nonobstructing right renal calculi. Electronically Signed   By: Suzen Dials M.D.   On: 10/10/2023 19:11    PROCEDURES:  Critical Care performed: No  Procedures  MEDICATIONS ORDERED IN ED: Medications  tamsulosin  (FLOMAX ) capsule 0.4 mg (0.4 mg Oral Given 10/10/23 2108)  oxyCODONE -acetaminophen  (PERCOCET/ROXICET) 5-325 MG per tablet 1 tablet (1 tablet Oral Given 10/10/23 1949)  sodium chloride  0.9 % bolus 1,000 mL (1,000 mLs Intravenous New Bag/Given 10/10/23 2103)  ondansetron  (ZOFRAN ) injection 4 mg (4 mg Intravenous Given 10/10/23 2104)  ketorolac  (TORADOL ) 15 MG/ML injection 15 mg (15 mg Intravenous Given 10/10/23 2103)    IMPRESSION / MDM / ASSESSMENT AND PLAN / ED COURSE  I reviewed the triage vital signs and the nursing notes.                             Clinical Course as of 10/10/23 2203  Mon Oct 10, 2023  2056 Basic metabolic panel Stable kidney function [MH]  2056 CBC WNL [MH]  2056 Urinalysis, Routine w reflex microscopic -Urine, Clean Catch(!) Large Hgb [MH]  2056 CT Renal Stone Study IMPRESSION: 1. 2 mm obstructing renal calculus within the distal right ureter. 2. 1 mm nonobstructing right renal calculi.   [MH]    Clinical Course User Index [MH] Margrette Rebbeca LABOR, PA-C   43 y.o. male presents to the emergency department for evaluation and treatment of acute right flank pain. See HPI for further details.   Differential diagnosis includes, but is not limited to nephrolithiasis, ureterolithiasis, muscle strain, UTI  Patient's presentation is most consistent with acute complicated illness / injury requiring diagnostic workup.  Patient is alert and oriented.  Elevated BP of 152/102 I suspect due to patient's acute pain.  Physical exam findings are as stated above.  Lab work reassuring.  CT renal  stone study confirms 1 mm nonobstructing right renal calculi and 2 mm obstructing renal calculus within the distal right ureter.  Percocet ordered in triage.  Will administer IV fluids, IV Toradol , Zofran  and tamsulosin  and then reassess.  Given reassuring lab results I do believe patient will be stable for discharge and outpatient follow-up with urology.  Will prescribe tamsulosin , Zofran  and oral Toradol  for further outpatient management.  Strainer given at discharge.   FINAL CLINICAL IMPRESSION(S) / ED DIAGNOSES   Final diagnoses:  Right flank pain  Kidney stone  Ureterolithiasis    Rx / DC Orders   ED Discharge Orders          Ordered    ondansetron  (ZOFRAN -ODT) 4 MG disintegrating tablet  Every 8 hours PRN        10/10/23 2203  tamsulosin  (FLOMAX ) 0.4 MG CAPS capsule  Daily        10/10/23 2203    ketorolac  (TORADOL ) 10 MG tablet  Every 8 hours PRN        10/10/23 2203            Note:  This document was prepared using Dragon voice recognition software and may include unintentional dictation errors.    Margrette, Florrie Ramires A, PA-C 10/10/23 2204    Jossie Artist POUR, MD 10/11/23 [phone]

## 2023-10-10 NOTE — Discharge Instructions (Addendum)
 You were evaluated in the ED for right flank pain.  Your lab work is reassuring.  Your CT renal study confirms to kidney stones.  Drink at least 8 to 10 glasses of water per day to help pass the stone.  Avoid sugary drinks, caffeine and alcohol.  Follow-up with urology in 7 days.  Call and schedule an appointment with Dr. Twylla for further evaluation.

## 2023-10-10 NOTE — ED Triage Notes (Signed)
 Pt to ED via POV c/o right flank pain that started about 2 hours ago. Pt has hx/o kidney stones and states that this feels the same. Pt denies nausea. Pt appears uncomfortable but is in NAD.

## 2023-10-14 ENCOUNTER — Other Ambulatory Visit: Payer: Self-pay

## 2023-10-14 ENCOUNTER — Encounter: Payer: Self-pay | Admitting: Emergency Medicine

## 2023-10-14 ENCOUNTER — Emergency Department: Admission: EM | Admit: 2023-10-14 | Discharge: 2023-10-14 | Disposition: A | Discharge status: Home / Self Care

## 2023-10-14 DIAGNOSIS — N179 Acute kidney failure, unspecified: Secondary | ICD-10-CM | POA: Diagnosis not present

## 2023-10-14 DIAGNOSIS — N202 Calculus of kidney with calculus of ureter: Secondary | ICD-10-CM | POA: Diagnosis not present

## 2023-10-14 DIAGNOSIS — R1030 Lower abdominal pain, unspecified: Secondary | ICD-10-CM | POA: Diagnosis not present

## 2023-10-14 LAB — URINALYSIS, ROUTINE W REFLEX MICROSCOPIC
Bacteria, UA: NONE SEEN
Bilirubin Urine: NEGATIVE
Glucose, UA: NEGATIVE mg/dL
Ketones, ur: NEGATIVE mg/dL
Leukocytes,Ua: NEGATIVE
Nitrite: NEGATIVE
Protein, ur: NEGATIVE mg/dL
Specific Gravity, Urine: 1.004 — ABNORMAL LOW (ref 1.005–1.030)
Squamous Epithelial / HPF: 0 /HPF (ref 0–5)
pH: 8 (ref 5.0–8.0)

## 2023-10-14 LAB — CBC
HCT: 39.6 % (ref 39.0–52.0)
Hemoglobin: 13.3 g/dL (ref 13.0–17.0)
MCH: 30.7 pg (ref 26.0–34.0)
MCHC: 33.6 g/dL (ref 30.0–36.0)
MCV: 91.5 fL (ref 80.0–100.0)
Platelets: 206 K/uL (ref 150–400)
RBC: 4.33 MIL/uL (ref 4.22–5.81)
RDW: 13.3 % (ref 11.5–15.5)
WBC: 6.7 K/uL (ref 4.0–10.5)
nRBC: 0 % (ref 0.0–0.2)

## 2023-10-14 LAB — BASIC METABOLIC PANEL WITH GFR
Anion gap: 7 (ref 5–15)
BUN: 12 mg/dL (ref 6–20)
CO2: 29 mmol/L (ref 22–32)
Calcium: 8.9 mg/dL (ref 8.9–10.3)
Chloride: 107 mmol/L (ref 98–111)
Creatinine, Ser: 1.32 mg/dL — ABNORMAL HIGH (ref 0.61–1.24)
GFR, Estimated: >60 mL/min (ref >60)
Glucose, Bld: 82 mg/dL (ref 70–99)
Potassium: 4.1 mmol/L (ref 3.5–5.1)
Sodium: 143 mmol/L (ref 135–145)

## 2023-10-14 MED ORDER — ONDANSETRON HCL 4 MG/2ML IJ SOLN
4.0000 mg | Freq: Once | INTRAMUSCULAR | Status: AC
  Administered 2023-10-14: 4 mg via INTRAVENOUS
  Filled 2023-10-14: qty 2

## 2023-10-14 MED ORDER — OXYCODONE HCL 5 MG PO TABS
5.0000 mg | ORAL_TABLET | Freq: Three times a day (TID) | ORAL | 0 refills | Status: AC | PRN
Start: 2023-10-14 — End: ?

## 2023-10-14 MED ORDER — IBUPROFEN 200 MG PO TABS: 600.0000 mg | ORAL_TABLET | Freq: Three times a day (TID) | ORAL | 2 refills | Status: AC | PRN

## 2023-10-14 MED ORDER — MORPHINE SULFATE (PF) 4 MG/ML IV SOLN
4.0000 mg | Freq: Once | INTRAVENOUS | Status: AC
  Administered 2023-10-14: 4 mg via INTRAVENOUS
  Filled 2023-10-14: qty 1

## 2023-10-14 MED ORDER — ACETAMINOPHEN 500 MG PO TABS
1000.0000 mg | ORAL_TABLET | Freq: Once | ORAL | Status: AC
  Administered 2023-10-14: 1000 mg via ORAL
  Filled 2023-10-14: qty 2

## 2023-10-14 MED ORDER — SODIUM CHLORIDE 0.9 % IV BOLUS
1000.0000 mL | Freq: Once | INTRAVENOUS | Status: AC
  Administered 2023-10-14: 1000 mL via INTRAVENOUS

## 2023-10-14 MED ORDER — ACETAMINOPHEN 500 MG PO TABS: 1000.0000 mg | ORAL_TABLET | Freq: Four times a day (QID) | ORAL | 2 refills | Status: AC | PRN

## 2023-10-14 NOTE — ED Notes (Signed)
 See triage note  Presents with cont'd abd pain  States discomfort is mainly at suprapubic area Recently dx;d with a renal stone   Pain is not any better

## 2023-10-14 NOTE — ED Triage Notes (Signed)
 C/O suprapubic pain. Seen on Monday for kidney stones, states pain persists.

## 2023-10-14 NOTE — ED Provider Notes (Signed)
 [facility] Provider Note    Event Date/Time   First MD Initiated Contact with Patient 10/14/23 1315     (approximate)   History   Abdominal Pain  C/O suprapubic pain. Seen on Monday for kidney stones, states pain persists.   HPI Jake Lee is a 43 y.o. male PMH urolithiasis presents for evaluation of flank/abdominal pain -Patient states he had a recurrence of his typical kidney stone pain.  Was recently seen in our emergency department on 10/10/2023 and diagnosed with new urolithiasis.  Has been taking Toradol , tamsulosin , Zofran  as needed though says he had an exacerbation of his pain today, felt the stone was moving.  No fevers.  Some nausea.  Pain is refractory to Toradol  today.  Remains able to urinate without difficulty. - Has an outpatient urologist that he is already established with though has not reached out to them since his initial ED visit for urolithiasis 4 days ago.   Per chart review, patient was seen in the emergency department on 10/10/2023 for right flank pain.  CT abdomen pelvis showed 2 mm obstructing renal calculus in right distal ureter and 1 mm nonobstructing right renal calculus.  Charged with tamsulosin , Zofran , oral Toradol  with plan for outpatient urology follow-up.      Physical Exam   Triage Vital Signs: ED Triage Vitals  Encounter Vitals Group     BP --      Girls Systolic BP Percentile --      Girls Diastolic BP Percentile --      Boys Systolic BP Percentile --      Boys Diastolic BP Percentile --      Pulse Rate 10/14/23 1245 (!) 51     Resp 10/14/23 1245 16     Temp 10/14/23 1245 98.2 F (36.8 C)     Temp Source 10/14/23 1245 Oral     SpO2 --      Weight 10/14/23 1246 170 lb (77.1 kg)     Height --      Head Circumference --      Peak Flow --      Pain Score 10/14/23 1246 8     Pain Loc --      Pain Education --      Exclude from Growth Chart --     Most recent vital signs: Vitals:   10/14/23 1245   Pulse: (!) 51  Resp: 16  Temp: 98.2 F (36.8 C)     General: Awake, no distress.  CV:  Good peripheral perfusion. RRR, RP 2+ Resp:  Normal effort. CTAB Abd:  No distention. Nontender to deep palpation throughout   ED Results / Procedures / Treatments   Labs (all labs ordered are listed, but only abnormal results are displayed) Labs Reviewed  URINALYSIS, ROUTINE W REFLEX MICROSCOPIC - Abnormal; Notable for the following components:      Result Value   Color, Urine COLORLESS (*)    APPearance CLEAR (*)    Specific Gravity, Urine 1.004 (*)    Hgb urine dipstick SMALL (*)    All other components within normal limits  BASIC METABOLIC PANEL WITH GFR - Abnormal; Notable for the following components:   Creatinine, Ser 1.32 (*)    All other components within normal limits  CBC     EKG  N/a   RADIOLOGY N/a    PROCEDURES:  Critical Care performed: No  Procedures   MEDICATIONS ORDERED IN ED: Medications  sodium chloride  0.9 % bolus 1,000  mL (1,000 mLs Intravenous New Bag/Given 10/14/23 1414)  morphine  (PF) 4 MG/ML injection 4 mg (4 mg Intravenous Given 10/14/23 1414)  ondansetron  (ZOFRAN ) injection 4 mg (4 mg Intravenous Given 10/14/23 1414)  acetaminophen  (TYLENOL ) tablet 1,000 mg (1,000 mg Oral Given 10/14/23 1414)     IMPRESSION / MDM / ASSESSMENT AND PLAN / ED COURSE  I reviewed the triage vital signs and the nursing notes.                              DDX/MDM/AP: Differential diagnosis includes, but is not limited to, urolithiasis though fortunately stone is small enough that it should pass without difficulty.  Did also have 1 small nephrolithiasis noted, consider second passing of stone.  Will screen for UTI.  Plan: - Labs - Pain control - IV fluid - No indication for repeat imaging at this time - Reassess  Patient's presentation is most consistent with acute presentation with potential threat to life or bodily function.   ED course below.  Workup  with mild AKI in setting of known underlying obstruction.  No evidence of underlying UTI.  Pain controlled with single dose of morphine  and IV fluids here in emergency department.  Discharged with oxycodone , Tylenol , Motrin .  Already has Flomax  and Zofran .  Plan to follow-up with urologist.  ED return precautions in place.  Patient agrees with plan.  Clinical Course as of 10/14/23 1602  Fri Oct 14, 2023  1315 Urinalysis with no evidence of infection [MM]  1316 BMP with AKI, otherwise unremarkable  CBC unremarkable [MM]  1557 Patient reevaluated, repeat abdominal exam remains benign.  Reassured by unremarkable workup.  Will discharge with oxycodone  as well as Tylenol  and Motrin  with plan to follow-up with his urologist.  Continue tamsulosin  and Zofran  as already prescribed, confirms he has large supply of these.  ED return precautions in place.  Patient and family agree with plan. [MM]    Clinical Course User Index [MM] Clarine Ozell LABOR, MD     FINAL CLINICAL IMPRESSION(S) / ED DIAGNOSES   Final diagnoses:  Calculus of kidney with calculus of ureter     Rx / DC Orders   ED Discharge Orders          Ordered    acetaminophen  (TYLENOL ) 500 MG tablet  Every 6 hours PRN        10/14/23 1601    ibuprofen  (MOTRIN  IB) 200 MG tablet  Every 8 hours PRN        10/14/23 1601    oxyCODONE  (ROXICODONE ) 5 MG immediate release tablet  Every 8 hours PRN        10/14/23 1601             Note:  This document was prepared using Dragon voice recognition software and may include unintentional dictation errors.   Clarine Ozell LABOR, MD 10/14/23 [phone]

## 2023-10-14 NOTE — Discharge Instructions (Signed)
 Your evaluation in the emergency department was consistent with your known kidney stone.  I have scribed you several pain medications to use as needed for any ongoing symptoms, and please continue to use the Flomax  medication to help pass the stone.  Please do call your urologist today to schedule follow-up appointment.  Return to the emergency department with any new or worsening symptoms including fever, inability to urinate, uncontrollable pain, or any other symptoms concerning to you.

## 2023-12-15 ENCOUNTER — Emergency Department
Admission: EM | Admit: 2023-12-15 | Discharge: 2023-12-15 | Disposition: A | Discharge status: Home / Self Care | Attending: Emergency Medicine | Admitting: Emergency Medicine

## 2023-12-15 ENCOUNTER — Other Ambulatory Visit: Payer: Self-pay

## 2023-12-15 DIAGNOSIS — S61411A Laceration without foreign body of right hand, initial encounter: Secondary | ICD-10-CM | POA: Insufficient documentation

## 2023-12-15 DIAGNOSIS — W268XXA Contact with other sharp object(s), not elsewhere classified, initial encounter: Secondary | ICD-10-CM | POA: Diagnosis not present

## 2023-12-15 DIAGNOSIS — Y990 Civilian activity done for income or pay: Secondary | ICD-10-CM | POA: Diagnosis not present

## 2023-12-15 MED ORDER — LIDOCAINE HCL (PF) 1 % IJ SOLN
5.0000 mL | Freq: Once | INTRAMUSCULAR | Status: AC
  Administered 2023-12-15: 5 mL
  Filled 2023-12-15: qty 5

## 2023-12-15 NOTE — ED Provider Notes (Signed)
 [facility] Provider Note    Event Date/Time   First MD Initiated Contact with Patient 12/15/23 1715     (approximate)   History   Laceration    HPI  Jake Lee is a 43 y.o. male    with a past medical history of kidney stones, anemia, dental pain,who presents to the ED complaining of serration. According to the patient was cutting boxes at work (this morning and 11 AM)and cut  his right hand.  Patient endorses at work they cleaned it and applied a butterfly dressing.  Last Tdap 2 months ago.    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     ROS: Patient currently denies any vision changes, tinnitus, difficulty speaking, facial droop, sore throat, chest pain, shortness of breath, abdominal pain, nausea/vomiting/diarrhea, dysuria, or weakness/numbness/paresthesias in any extremity   Physical Exam   Triage Vital Signs: ED Triage Vitals  Encounter Vitals Group     BP 12/15/23 1649 121/81     Girls Systolic BP Percentile --      Girls Diastolic BP Percentile --      Boys Systolic BP Percentile --      Boys Diastolic BP Percentile --      Pulse Rate 12/15/23 1649 64     Resp 12/15/23 1649 20     Temp 12/15/23 1649 99.8 F (37.7 C)     Temp Source 12/15/23 1649 Oral     SpO2 12/15/23 1649 98 %     Weight 12/15/23 1647 170 lb (77.1 kg)     Height 12/15/23 1647 5' 11 (1.803 m)     Head Circumference --      Peak Flow --      Pain Score 12/15/23 1647 5     Pain Loc --      Pain Education --      Exclude from Growth Chart --     Most recent vital signs: Vitals:   12/15/23 1649  BP: 121/81  Pulse: 64  Resp: 20  Temp: 99.8 F (37.7 C)  SpO2: 98%     Physical Exam Vitals and nursing note reviewed.  Vital signs were normal at triage  Constitutional:      General: Awake and alert. No acute distress.    Appearance: Normal  appearance. The patient is normal weight.      Able to speak in complete sentences without cough or dyspnea  HENT:     Head: Normocephalic and atraumatic.     Mouth: Mucous membranes are moist.  Eyes:     General: PERRL. Normal EOMs          Conjunctiva/sclera: Conjunctivae normal.  Nose No congestion/rhinorrhea  CV:                  Good peripheral perfusion.  Regular rate and rhythm  Resp:               Normal effort.  Equal breath sounds bilaterally.  Abd:                 No distention.  Soft, nontender.  No rebound or guarding.  Musculoskeletal:        General: No swelling. Normal range of motion.  Right hand: Laceration in the volar side of there for metacarpal.  1 cm length, 3 mm depth, no active bleeding.  Opposition  is intact but painful.  Pulses positive.  Patient is intact Skin:    General: Skin is warm and dry.     Capillary Refill: Capillary refill takes less than 2 seconds.     Findings: No rash.  Neurological:     Mental Status: The patient is awake and alert. MAE spontaneously. No gross focal neurologic deficits are appreciated.  Psychiatric Mood and affect are normal. Speech and behavior are normal.  ED Results / Procedures / Treatments   Labs (all labs ordered are listed, but only abnormal results are displayed) Labs Reviewed - No data to display    PROCEDURES:  Critical Care performed:   .Laceration Repair  Date/Time: 12/15/2023 6:51 PM  Performed by: Janit Kast, PA-C Authorized by: Janit Kast, PA-C   Consent:    Consent obtained:  Verbal   Consent given by:  Patient   Risks, benefits, and alternatives were discussed: yes     Risks discussed:  Pain and infection Universal protocol:    Procedure explained and questions answered to patient or proxy's satisfaction: yes     Patient identity confirmed:  Verbally with patient Anesthesia:    Anesthesia method:  Local infiltration   Local anesthetic:  Lidocaine  1% WITH epi Laceration  details:    Location:  Hand   Hand location:  R palm   Length (cm):  2   Depth (mm):  3 Pre-procedure details:    Preparation:  Patient was prepped and draped in usual sterile fashion Exploration:    Limited defect created (wound extended): no     Hemostasis achieved with:  Direct pressure   Imaging outcome: foreign body not noted     Contaminated: no   Treatment:    Area cleansed with:  Povidone-iodine   Amount of cleaning:  Standard   Irrigation solution:  Sterile saline   Irrigation method:  Tap   Debridement:  None Skin repair:    Repair method:  Sutures   Suture size:  5-0   Suture technique:  Simple interrupted   Number of sutures:  5 Approximation:    Approximation:  Close Repair type:    Repair type:  Simple Post-procedure details:    Dressing:  Non-adherent dressing   Procedure completion:  Tolerated well, no immediate complications    MEDICATIONS ORDERED IN ED: Medications  lidocaine  (PF) (XYLOCAINE ) 1 % injection 5 mL (has no administration in time range)      IMPRESSION / MDM / ASSESSMENT AND PLAN / ED COURSE  I reviewed the triage vital signs and the nursing notes.  Differential diagnosis includes, but is not limited to, laceration, avulsion, foreign body.  Patient's presentation is most consistent with acute, uncomplicated illness.   Jake Lee is a 43 y.o., male who presents today with history of laceration on his right first finger.  On physical exam evidence of laceration in the volar side of the first metacarpal, no active bleeding, no foreign bodies. Plan Suture Discharge Patient's diagnosis is consistent with laceration right hand.  I did not order any imaging or labs physical exam is reassuring I did review the patient's allergies and medications.The patient is in stable and satisfactory condition for discharge home  Patient will be discharged home without prescriptions. Patient is to follow up with PCP for suture removal as needed or otherwise  directed. Patient is given ED precautions to return to the ED for any worsening or new symptoms.  Work note was provided Discussed plan of care with patient, answered all  of patient's questions, Patient agreeable to plan of care. Advised patient to take medications according to the instructions on the label. Discussed possible side effects of new medications. Patient verbalized understanding.   FINAL CLINICAL IMPRESSION(S) / ED DIAGNOSES   Final diagnoses:  Laceration of right hand without foreign body, initial encounter     Rx / DC Orders   ED Discharge Orders     None        Note:  This document was prepared using Dragon voice recognition software and may include unintentional dictation errors.   Janit Kast, PA-C 12/15/23 CLAIR    Arlander Charleston, MD 12/15/23 JUDITHANN

## 2023-12-15 NOTE — ED Triage Notes (Signed)
 Pt has laceration to right hand.  Pt cut self opening a box at work.  NO WC.  Bleeding controlled.  Pt alert.

## 2023-12-15 NOTE — Discharge Instructions (Signed)
 You have been diagnosed with laceration of the right hand.  Please do not submerge your hand in water to prevent infection.  Please check for signs of infection.  Make an appointment with your PCP in 6 days for suture removal.  Please come back to ED with your PCP if you have any symptoms symptoms worsen.  It was a pleasure to help you today .
